# Patient Record
Sex: Female | Born: 1975 | Race: Black or African American | Hispanic: No | Marital: Single | Smoking: Never smoker
Health system: Southern US, Community
[De-identification: ages and names within clinical notes are randomized; demographics above are authoritative.]

## PROBLEM LIST (undated history)

## (undated) DIAGNOSIS — I10 Essential (primary) hypertension: Secondary | ICD-10-CM

## (undated) DIAGNOSIS — E119 Type 2 diabetes mellitus without complications: Secondary | ICD-10-CM

## (undated) DIAGNOSIS — E7800 Pure hypercholesterolemia, unspecified: Secondary | ICD-10-CM

---

## 2004-09-24 ENCOUNTER — Ambulatory Visit: Payer: Self-pay | Admitting: Family Medicine

## 2004-09-24 ENCOUNTER — Inpatient Hospital Stay (HOSPITAL_COMMUNITY): Admission: EM | Admit: 2004-09-24 | Discharge: 2004-10-03 | Payer: Self-pay | Admitting: Emergency Medicine

## 2004-10-06 ENCOUNTER — Ambulatory Visit: Payer: Self-pay | Admitting: Family Medicine

## 2004-10-07 ENCOUNTER — Ambulatory Visit: Payer: Self-pay | Admitting: Hematology and Oncology

## 2004-10-09 ENCOUNTER — Ambulatory Visit: Payer: Self-pay | Admitting: Family Medicine

## 2004-10-17 ENCOUNTER — Encounter (HOSPITAL_COMMUNITY): Admission: RE | Admit: 2004-10-17 | Discharge: 2005-01-15 | Payer: Self-pay | Admitting: *Deleted

## 2004-10-19 ENCOUNTER — Encounter (INDEPENDENT_AMBULATORY_CARE_PROVIDER_SITE_OTHER): Payer: Self-pay | Admitting: *Deleted

## 2004-10-19 LAB — CONVERTED CEMR LAB

## 2004-10-30 ENCOUNTER — Ambulatory Visit: Payer: Self-pay | Admitting: Family Medicine

## 2004-11-13 ENCOUNTER — Ambulatory Visit: Payer: Self-pay | Admitting: Family Medicine

## 2004-11-13 ENCOUNTER — Encounter (INDEPENDENT_AMBULATORY_CARE_PROVIDER_SITE_OTHER): Payer: Self-pay | Admitting: Specialist

## 2005-01-28 ENCOUNTER — Ambulatory Visit: Payer: Self-pay | Admitting: Hematology and Oncology

## 2005-02-10 ENCOUNTER — Ambulatory Visit: Payer: Self-pay | Admitting: Family Medicine

## 2005-03-31 ENCOUNTER — Ambulatory Visit: Payer: Self-pay | Admitting: Family Medicine

## 2005-04-10 ENCOUNTER — Ambulatory Visit: Payer: Self-pay | Admitting: Sports Medicine

## 2005-12-10 ENCOUNTER — Ambulatory Visit: Payer: Self-pay | Admitting: Sports Medicine

## 2006-01-20 ENCOUNTER — Ambulatory Visit: Payer: Self-pay | Admitting: Family Medicine

## 2006-05-14 ENCOUNTER — Encounter (INDEPENDENT_AMBULATORY_CARE_PROVIDER_SITE_OTHER): Payer: Self-pay | Admitting: *Deleted

## 2006-12-28 ENCOUNTER — Telehealth: Payer: Self-pay | Admitting: *Deleted

## 2007-01-26 ENCOUNTER — Ambulatory Visit: Payer: Self-pay | Admitting: Family Medicine

## 2007-02-07 IMAGING — CR DG CHEST 1V PORT
1 series · 1 of 1 positions shown · non-contrast
Comparison: None.

CLINICAL DATA: Diabetes and hyperglycemia.
 PORTABLE [id number] VIEW -09/24/04 AT 5998 HOURS:

[view not recorded]
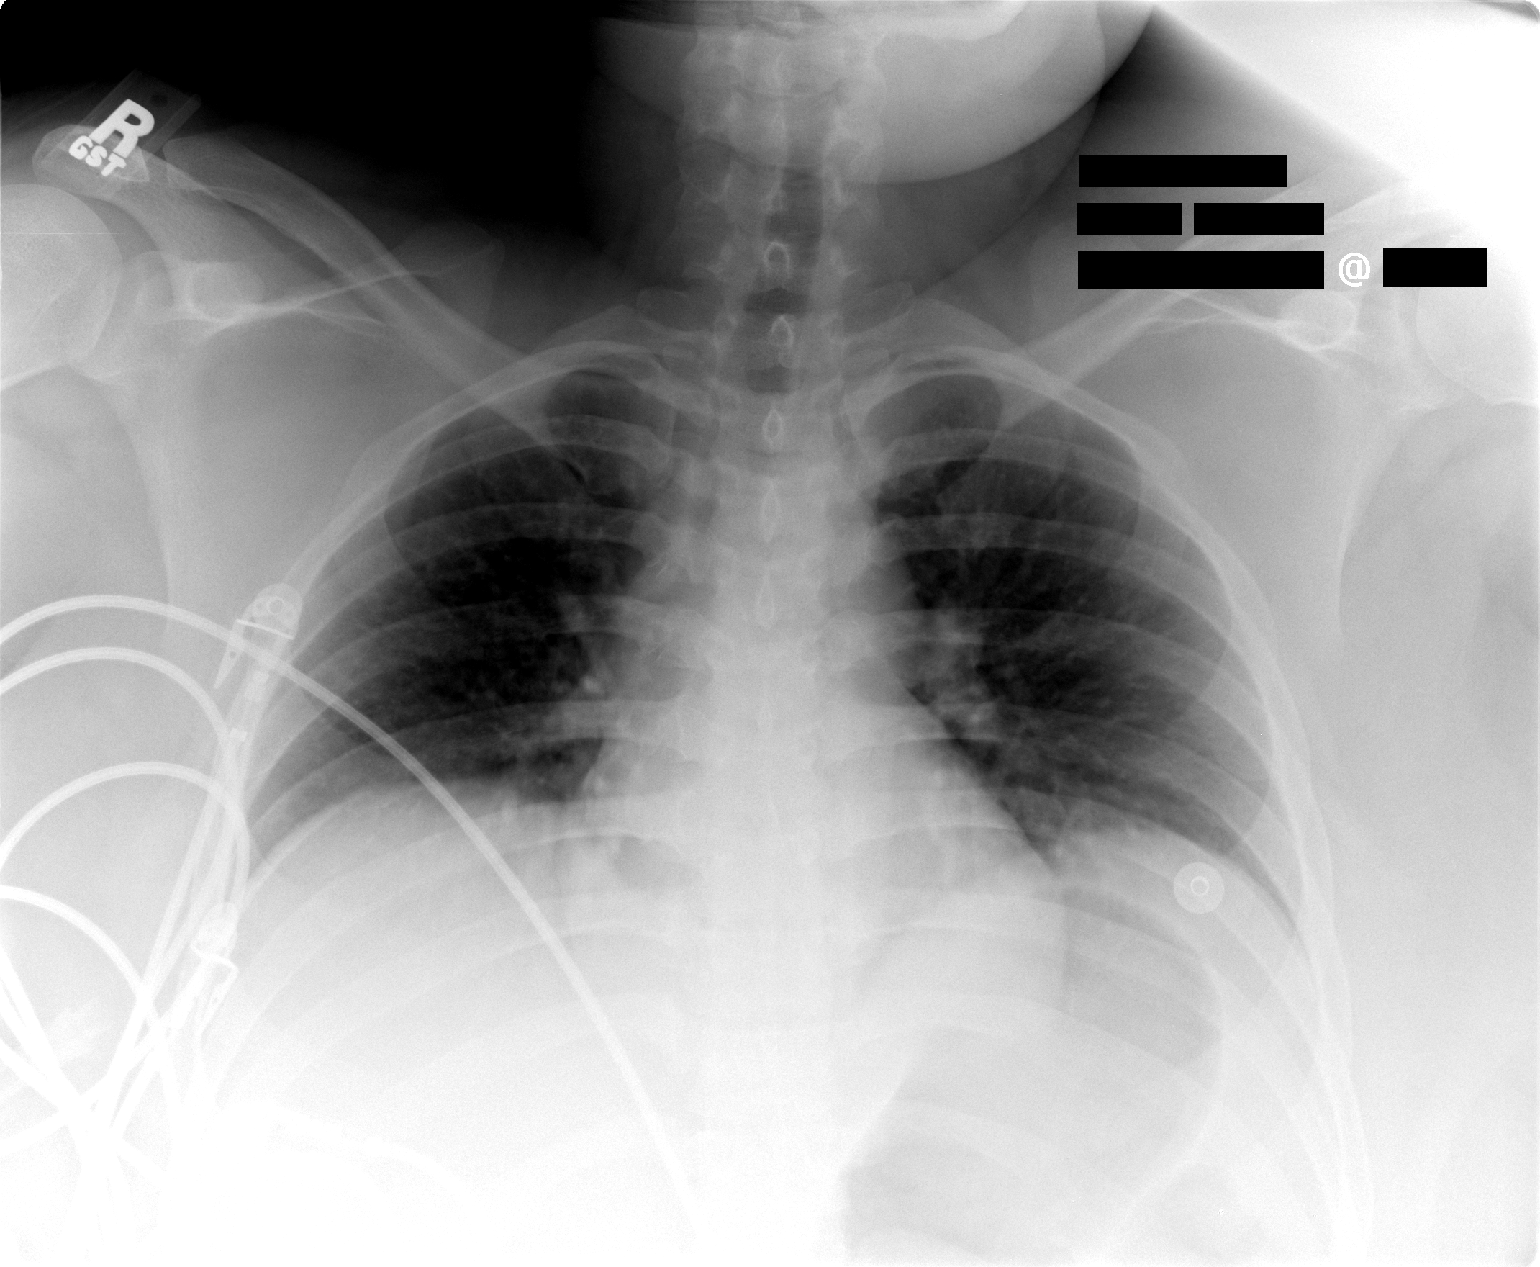

[1 of 1 positions shown; findings below may reference images not displayed]

There are low lung volumes with mild bibasilar atelectasis.  No consolidation, pleural effusion, or pneumothorax is seen.  The cardiomediastinal contours are normal.  Soft tissues are prominent.
IMPRESSION: Suboptimal inspiration with bibasilar atelectasis.  No consolidation.

## 2007-02-16 ENCOUNTER — Encounter (INDEPENDENT_AMBULATORY_CARE_PROVIDER_SITE_OTHER): Payer: Self-pay | Admitting: *Deleted

## 2008-03-05 ENCOUNTER — Encounter: Payer: Self-pay | Admitting: Sports Medicine

## 2010-04-17 NOTE — Miscellaneous (Signed)
 Summary: Orders Update  Clinical Lists Changes  Orders: Added new Test order of Basic Met-[facility] [phone]) - Signed Added new Test order of Lipid-[facility] [phone]) - Signed Added new Test order of A1C-[facility] ([id number]) - Signed Added new Test order of UA Microalbumin-[facility] ([id number]) - Signed Added new Test order of CBC-[facility] ([id number]) - Signed

## 2010-08-01 NOTE — Discharge Summary (Signed)
NAMEKADINCE, BOXLEY             ACCOUNT NO.:  [id number]   MEDICAL RECORD NO.:  [id number]          PATIENT TYPE:  INP   LOCATION:  5014                         FACILITY:  [facility]   PHYSICIAN:  Melina Fiddler, MD DATE OF BIRTH:  [birth year]   DATE OF ADMISSION:  09/23/2004  DATE OF DISCHARGE:  10/03/2004                                 DISCHARGE SUMMARY   CONSULTATIONS:  Nephrology, Maree Krabbe, M.D.   DISCHARGE DIAGNOSES:  1.  Diabetes mellitus.  2.  Acute renal failure.  3.  Chronic renal failure.  4.  Depression.  5.  Anemia.  6.  Gastroesophageal reflux disease.  7.  Alcohol abuse.   PROCEDURES:  1.  Renal ultrasound on September 27, 2004, showing mildly echogenic kidneys.  2.  Portable chest x-ray on September 24, 2004, showing bibasilar atelectasis      consistent with poor inspiration.  3.  Transfusion of two units of packed red blood cells on October 01, 2004.   DISCHARGE MEDICATIONS:  1.  Multivitamin with minerals.  2.  Thiamine 100 mg p.o. daily.  3.  Senokot S one tablet p.o. daily.  4.  Aranesp 100 mcg subcutaneous each week at the [facility].  5.  Lantus 35 units b.i.d. subcutaneous.  6.  Regular insulin 10 units t.i.d. a.c.  7.  Trazodone 25 mg p.o. q.h.s.  8.  Prilosec 20 mg p.o. daily.  9.  Lexapro 10 mg p.o. daily.   HOSPITAL COURSE:  The patient was admitted on September 24, 2004, complaining of  malaise x1 week, worsening acutely.  At the time of admission she was very  somnolent.  Her roommate is a Interior and spatial designer and had checked her for strep  test, which she said was positive.  The patient had a blood sugar on admit  up to 1100 and was placed on the Glucommander insulin drip.  We gave her a  penicillin shot for possible strep.  Immediately after admission, her blood  pressure dropped and her temperature spiked, so we started her on broad-  spectrum antibiotics including Zosyn and vancomycin and transferred her to  the ICU.  She responded well to intense  rehydration and blood sugar control.  Her antibiotics were discontinued after two days with negative blood  cultures.  Her kidney function was also decreased on admit with creatinine  up to 5.  She did maintain urine output throughout the admission.  Her blood  sugars were very difficult to control, and at one point she required more  than 300 units of insulin per day with uncontrolled sugars.  Over time she  required less and less insulin and was stable before discharge on a dosage  of 40 units Lantus b.i.d. and 15 units t.i.d. a.c.  We decided to be  conservative since she says that she does not eat as much at home, so we  sent her out on 33 units b.i.d. of Lantus and 10 units t.i.d. a.c. of  regular insulin.  She had some difficulty sleeping during her hospital  admission and was started on trazodone 25 mg p.o. q.h.s.  Renal  was  consulted after several days and total rehydration with a creatinine still  high around 4.  We obtained a renal ultrasound and many kidney-related blood  tests.  It was determined that the patient most likely has underlying  chronic kidney disease, and the patient was informed that she may need  dialysis in the future.  Her creatinine did continue to drop throughout her  hospital stay and is about 3.3 on discharge.  She was prepped for discharge  on about October 01, 2004, at which time we did a discharge CBC and she was  anemic down to 5.5.  At that time she received two units of packed red blood  cells.  She was fecal occult blood-negative at that time.  It was determined  that this is most likely an anemia related to decreased production of red  blood cells.  She is on Aranesp and her iron stores were high, so she was  not started on iron.  The Aranesp is 100 mcg subcutaneous every week.  She  was given one dose of Aranesp right before discharge.  Her anemia was fairly  stable after transfusion of two units PRBC at about 7 for hemoglobin.   Admission labs:   White blood count 22.8, hemoglobin 14.8, hematocrit 49.3,  platelets 424.  Neutrophils 92%, 21 absolute.  Sodium 12, potassium 4.8,  chloride 99, CO2 12, glucose 885, BUN 76, creatinine 6.2, calcium 9.0.  Ketones negative.  Lactic acid 6.1.  Urinalysis:  100 protein.   Problem 1.  DIABETES MELLITUS:  The patient came in with extremely high  blood sugars, up to 1100+.  After aggressive insulin administration and  rehydration, eventually the patient was maintained on a stable blood sugar  with 40 units of Lantus b.i.d. and 15 units q.a.c. t.i.d.  She had one  hypoglycemic episode earlier in the course when she was requiring very high  amounts of insulin.  At discharge, she was sent out on a more conservative  dose of 33-35 units of Lantus b.i.d. and 10 units of regular insulin t.i.d.  a.c.  She is instructed to check her blood sugars three times a day before  meals and keep a record for her follow-up appointment.  We believe that the  patient has type 2 diabetes but wanted to get her blood sugar stabilized on  an insulin regimen.  In the outpatient, it will be important to try to get  her on oral hypoglycemics and limit the need for insulin.  If insulin is  needed, it may be wise to switch to an NPH regimen since she has very  limited financial resources.  This may include an NPH dose since NPH is much  cheaper than Lantus.   Problem 2.  ACUTE RENAL FAILURE/CHRONIC RENAL FAILURE:  This will be managed  in the outpatient setting by renal doctors.  She has an appointment with Dr.  Kathrene Bongo on August 25 at 9:15 a.m.  In the meantime, her creatinine is  3.3 at discharge.  She was started on many vitamins, including multivitamin  with minerals, thiamine.  She is also on Aranesp 100 mg subcutaneous each  week.  It was thought that the patient had some element of prerenal failure  with her initial admission, but this did not correct back to normal creatinine so it was thought that the patient  had some chronic kidney  disease given the proteinuria and echogenic kidneys and high PTH but  creatinine does still continue to correct  partially, so there may be an  element of ATN involved as well.   Problem 3.  ANEMIA:  The patient came in with normal hemoglobin and  hematocrit.  She did have a drop in hemoglobin and hematocrit most likely  related to rehydration, a dilutional anemia, and her blood counts were  stable at around 9.5 for hemoglobin until the 19th, at which time her  hemoglobin was 5.5.  At that time we gave two units of packed red blood  cells and her anemia was fairly constant at discharge at around 7.0.  This  was without symptoms.  The patient's orthostatic vital signs were normal.  The patient was fecal occult blood-negative.  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  Also, part of her anemia is related to her chronic kidney disease and that  is why she is on the Aranesp.  Her iron supply was normal, so she was not  started on iron supplementation.   Problem 4.  GASTROESOPHAGEAL REFLUX DISEASE:  The patient was started on a  proton pump inhibitor, sent out on Prilosec 20 mg p.o. daily.   Problem 5.  DEPRESSION:  The patient came in on Lexapro 10 mg p.o. daily and  was also to be sent out on Lexapro 10 mg p.o. daily.  She also has trazodone  25 mg p.o. q.h.s. to help with sleep and mood.   Problem 6.  ALCOHOLISM:  The patient was counseled to stop drinking or, at  the very least, decrease her drinking as it is having a negative effect on  her health, especially her anemia.   DISCHARGE LABORATORY DATA:  CBC:  White blood cells 19.0, hemoglobin 7.0,  hematocrit 20.4, and platelets 301.  Sodium 144, potassium 3.4 before 40 mEq  of K-Dur administered, chloride 11, CO2 24, glucose 96, BUN 25, creatinine  3.3, calcium 9.1.  PT and APTT in the normal range.  Total protein over 24  hours of urine  collection 649.  ANA negative.  Parathyroid hormone 117.7.  HIV negative.   FOLLOW-UP ITEMS:  1.  Diabetes mellitus.  The patient is to follow up with the nurse      practitioner at the family practice center, Ms. Saxon, on October 09, 2004,      for follow-up of the last week of blood sugars.  After that, she will      try to follow up with Dr. Irving Burton.  At this appointment, it is      important that the patient be tried on p.o. medications for her diabetes      or, alternatively, NPH insulin since Lantus is so expensive and      difficult for her to afford.  2.  Acute renal failure.  This is to be managed by the renal doctors.  She      has an appointment with Dr. Kathrene Bongo on August 25 at 9:15 a.m.  3.  Alcohol abuse.  It is recommended that at the follow-up appointment with      Ms. Saxon the patient be counseled on her alcohol use.  4.  Depression.  The patient may benefit from increase in her Lexapro from      10 to 20 mg daily. 5.  Limited financial capacity.  It is important that the family practice      center be sensitive to her needs in this area and be willing to provide  samples of such medications as Lantus and Lexapro when available.  6.  Anemia.  The patient will follow up her hemoglobin and hematocrit on      Monday, July 24, in the morning at the family practice center.  The      patient will be given Aranesp 100 mg subcutaneous each week at the      family practice center, where it has been arranged for a special program      to reimburse the shots to the pharmacy.      Clifton Custard   AL/MEDQ  D:  10/03/2004  T:  10/04/2004  Job:  [id number]   cc:   Maree Krabbe, M.D.  Fax: [phone]   Angeline Slim, M.D.  Fax: [phone]   Ms. Humberto Seals, NP. [facility]

## 2010-08-01 NOTE — H&P (Signed)
Carla Elliott, Carla Elliott             ACCOUNT NO.:  [id number]   MEDICAL RECORD NO.:  [id number]          PATIENT TYPE:  INP   LOCATION:  1823                         FACILITY:  [facility]   PHYSICIAN:  Pearlean Brownie, M.D.DATE OF BIRTH:  [birth year]   DATE OF ADMISSION:  09/23/2004  DATE OF DISCHARGE:                                HISTORY & PHYSICAL   CHIEF COMPLAINT:  Weakness.   HISTORY OF PRESENT ILLNESS:  The patient is a 35 year old obese, African  American female with a one-week history of progressive weakness and malaise  that acutely worsened today. She was suffering from polydipsia and polyuria.  She has a roommate who is a Research scientist (medical) who did a strep test on her very  recently that was reportedly positive. Due to her acute worsening of  symptoms, she was brought to an Urgent Care today and transferred here after  an increased blood sugar and decreased responsiveness.   MEDICATIONS:  Lexapro.   ALLERGIES:  No known drug allergies.   PAST MEDICAL HISTORY:  Depression with a suicide attempt 10 years ago.   FAMILY HISTORY:  Positive for diabetes and multiple myelomas.   SOCIAL HISTORY:  Lives with her roommate. Denies smoking or drugs but admits  to alcohol use.,   REVIEW OF SYSTEMS:  Negative.   PHYSICAL EXAMINATION:  VITAL SIGNS:  Temperature 98.1, blood pressure  118/58, pulse 139, respiratory rate 22 to 28. Saturations 96% on two liters.  GENERAL:  Moderate distress, obese, African American female.  CARDIOVASCULAR:  Tachycardic. Regular rhythm. No murmurs, gallops, or rubs.  No carotid bruits. Distal pulses are 2+.  PULMONARY:  Clear to auscultation bilaterally.  ABDOMEN:  Soft and nontender with decreased bowel sounds. No  hepatosplenomegaly and obese belly.  EXTREMITIES:  No edema, no cyanosis.  NEUROLOGICAL:  Diffusely weak. Difficulty cooperating, concentrating for  cranial nerve and other neurological test.  LYMPHATICS:  No lymphadenopathy.  BACK:  No CVA  tenderness.  MUSCULOSKELETAL:  The patient is diffusely weak.  HEENT:  Normocephalic and atraumatic. Moist mucus membranes. Pupils are  equal, round, and reactive to light and accommodation.  PSYCHIATRIC:  The patient responds appropriately after prompting but is  somnolent.  GENITOURINARY:  Foley in place.   LABORATORY DATA:  EKG shows sinus tachycardia. Sodium 131, potassium 4.7,  chloride 103, bicarbonate of 14.0, BUN 77, creatinine 5.0. Glucose greater  than 700. Anion gap 23. Hemoglobin 14.8, white blood cell 22.8, platelets  424,000. Absolute neutrophil count 21. Urine pregnancy was pending and she  is reportedly rapid strep positive.   ASSESSMENT:  A 35 year old African American female with acute hyperglycemia  and anion gap, metabolic acidosis.   PLAN:  1.  Hyperglycemia:  Continue insulin drip and intravenous fluids until anion      gap or acidosis corrects. We will check B-type natriuretic peptide every      two hours until this corrects. We will then change to a subcutaneous      insulin regimen most likely consisting of Lantus and most likely attempt      to switch to p.o. medications in an outpatient setting.  2.  Positive rapid strep test:  The patient received penicillin-G IM in the      emergency department.  3.  Leukocytosis:  Most likely this is related to infection, possibly strep      and was treated with penicillin. However, we will check the urine. If      the patient has continued leukocytosis or worsening leukocytosis of if      the patient becomes afebrile, we would consider checking a chest x-ray,      although I doubt this will review anything as physical exam showed the      lungs clear to auscultation and there is no history of symptoms      consistent with pneumonia.  4.  Depression:  We will hold Lexapro for now for somnolence and restart      once the patient is more responsive.       AL/MEDQ  D:  09/24/2004  T:  09/24/2004  Job:  [id number]

## 2010-08-01 NOTE — Discharge Summary (Signed)
NAMEAURIA, Carla Elliott             ACCOUNT NO.:  [id number]   MEDICAL RECORD NO.:  [id number]          PATIENT TYPE:  INP   LOCATION:  5014                         FACILITY:  [facility]   PHYSICIAN:  Melina Fiddler, MD DATE OF BIRTH:  01/20/1976   DATE OF ADMISSION:  09/23/2004  DATE OF DISCHARGE:                                 DISCHARGE SUMMARY   ADDENDUM:  Please add to the medication list:  Tums x2 b.i.d.   Also add under the renal failure paragraph:  The patient will take two  Tums b.i.d. because of the calcium, which will act as a phosphate binder.      Clifton Custard   AL/MEDQ  D:  10/03/2004  T:  10/04/2004  Job:  [id number]

## 2010-08-01 NOTE — Consult Note (Signed)
NAMEAIJALON, Carla Elliott             ACCOUNT NO.:  [id number]   MEDICAL RECORD NO.:  [id number]          PATIENT TYPE:  INP   LOCATION:  2303                         FACILITY:  [facility]   PHYSICIAN:  Maree Krabbe, M.D.DATE OF BIRTH:  17-May-1975   DATE OF CONSULTATION:  09/26/2004  DATE OF DISCHARGE:                                   CONSULTATION   REASON FOR CONSULTATION:  Renal insufficiency.   HISTORY:  The patient is a 35 year old African American female with a  history of obesity and depression, but no prior renal disease admitted September 23, 2004, with a less than or equal to one week of polyuria and polydipsia,  and brought to the hospital with hypotension and tachycardia, found to have  severe hyperglycemia with a blood sugar over 700, consistent with new onset  diabetes. This was associated with the moderately severe acidemia with pH of  7.2 and renal insufficiency with a creatinine of 5.8. With hydration over  the past three days urine output has finally started to improve from about  30 cc to 50 cc per hour yesterday up to greater than 100 cc per hour  currently and over the last eight hours she had 1200 cc of urine without  diuretics. She is getting IV fluids now at 150 cc and 200 cc per hour and  creatinine is down to 5.0 today. Initially urinalysis was mostly benign with  100 protein and no red cells or white cells; however, a follow-up urinalysis  showed greater than 300 protein with 25 to 50 red blood cells and 5 to 10  white blood cells. Blood pressure on admission was 90/50 with a heart rate  of 140 taken by  EMS.   The patient's complaint relate mainly to the one week prior to admission and  prior to that she says she was feeling good and had no chronic symptoms of  anorexia, fatigue, nausea, vomiting, or other symptoms of uremia. Her main  symptoms were polydipsia.   PAST MEDICAL HISTORY:  Depression on Lexapro.   MEDICATIONS AT HOME:  She was on Lexapro only and  in the hospital on  Lexapro, Lovenox, Lantus, and Protonix.   ALLERGIES:  None.   FAMILY HISTORY:  Single. No children. The patient works as a Printmaker. No tobacco or alcohol.   REVIEW OF SYSTEMS:  GENERAL: Denies fever, chills, weight loss, nightsweats.  ENT: Denies hearing loss, visual change, sore throat, difficulty swallowing.  CARDIORESPIRATORY: Denies chest pain, shortness of breath, productive cough,  orthopnea, or PND. GI: As above. GU: Denies dysuria, hematuria, history of  kidney problems, or kidney stones. MUSCULOSKELETAL: Denies arthralgias,  myalgias, history of arthritis. NEUROLOGIC: Denies focal numbness, weakness,  or history of stroke or seizure. ENDOCRINE: As above. DERMATOLOGIC: Denies  any rash or hair loss.   PHYSICAL EXAMINATION:  VITAL SIGNS: Blood pressure 140/70, pulse 100,  respirations 20, temperature 99.7. I&Os yesterday 6200 and 1300 out.  GENERAL: This is a quite obese black female in no acute distress, calm and  pleasant.  SKIN: Without rash.  HEENT: PERRL. EOMI.  Throat clear.  NECK: Supple with flat neck veins.  CHEST: Clear throughout.  CARDIAC: Regular rate and rhythm without murmurs, rubs, or gallops.  ABDOMEN: Soft, obese, nontender. No organomegaly.  EXTREMITIES: No significant lower extremity edema. Pulses are 2+ in the legs  with no bruits.   LABORATORY DATA:  Sodium 147, potassium 3.9, CO2 18, BUN 52, and creatinine  5.2 today. White blood cell count 20,000, hemoglobin 12, calcium 7,  phosphorus 2.4.   Chest x-ray reveals no acute disease. Urinalysis #1 reveals greater than  1000 glucose, protein 100, white blood cells 0-2, red blood cells 0-2, a few  bacteria. Urinalysis #2 reveals glucose of 100, protein greater than 300,  white blood cell count 3-6, RBC 25-50, and bacteria few.   IMPRESSION:  Renal insufficiency in the setting of a young, overweight black  female in the setting of new onset diabetes with severe volume  losses and  hypotension on admission. Initial urinalysis was probably more accurate and  was benign except for 100 mg/dl of protein. I will examine the urine  tomorrow after the 24-hour urine is collected. I suspect this is acute renal  insufficiency related to hypovolemia and volume losses, and since the urine  output is improving today and the volume is probably now close to normal, I  would not pursue further diagnostic workup until we can see the results of  the 24 hour urine collection, re-examine the urine sediment and see what her  creatinine does over the next day or two. She could have underlying  intrinsic renal disease also, but I think we will await these other studies  before pursuing further diagnostic workup. I would order a renal ultrasound  and I have done this. I agree with the current IV fluids and supportive care  as you are doing.       RDS/MEDQ  D:  09/26/2004  T:  09/26/2004  Job:  [id number]

## 2012-01-22 ENCOUNTER — Emergency Department (HOSPITAL_BASED_OUTPATIENT_CLINIC_OR_DEPARTMENT_OTHER)
Admission: EM | Admit: 2012-01-22 | Discharge: 2012-01-23 | Disposition: A | Discharge status: Home / Self Care | Payer: Self-pay | Attending: Emergency Medicine | Admitting: Emergency Medicine

## 2012-01-22 ENCOUNTER — Encounter (HOSPITAL_BASED_OUTPATIENT_CLINIC_OR_DEPARTMENT_OTHER): Payer: Self-pay | Admitting: *Deleted

## 2012-01-22 DIAGNOSIS — E119 Type 2 diabetes mellitus without complications: Secondary | ICD-10-CM | POA: Insufficient documentation

## 2012-01-22 DIAGNOSIS — R739 Hyperglycemia, unspecified: Secondary | ICD-10-CM

## 2012-01-22 DIAGNOSIS — R7309 Other abnormal glucose: Secondary | ICD-10-CM | POA: Insufficient documentation

## 2012-01-22 DIAGNOSIS — Z79899 Other long term (current) drug therapy: Secondary | ICD-10-CM | POA: Insufficient documentation

## 2012-01-22 DIAGNOSIS — R51 Headache: Secondary | ICD-10-CM | POA: Insufficient documentation

## 2012-01-22 DIAGNOSIS — R0789 Other chest pain: Secondary | ICD-10-CM | POA: Insufficient documentation

## 2012-01-22 HISTORY — DX: Type 2 diabetes mellitus without complications: E11.9

## 2012-01-22 LAB — URINALYSIS, ROUTINE W REFLEX MICROSCOPIC
Glucose, UA: >1000 mg/dL — AB
Ketones, ur: 15 mg/dL — AB
Leukocytes, UA: NEGATIVE
Nitrite: NEGATIVE
Specific Gravity, Urine: 1.041 — ABNORMAL HIGH (ref 1.005–1.030)
pH: 5 (ref 5.0–8.0)

## 2012-01-22 LAB — GLUCOSE, CAPILLARY

## 2012-01-22 LAB — CBC WITH DIFFERENTIAL/PLATELET
Basophils Absolute: 0 10*3/uL (ref 0.0–0.1)
Basophils Relative: 0 % (ref 0–1)
Eosinophils Absolute: 0.1 10*3/uL (ref 0.0–0.7)
Eosinophils Relative: 1 % (ref 0–5)
MCH: 25.8 pg — ABNORMAL LOW (ref 26.0–34.0)
MCHC: 32.3 g/dL (ref 30.0–36.0)
MCV: 80 fL (ref 78.0–100.0)
Neutrophils Relative %: 65 % (ref 43–77)
Platelets: 290 10*3/uL (ref 150–400)
RBC: 4.3 MIL/uL (ref 3.87–5.11)
RDW: 12.4 % (ref 11.5–15.5)

## 2012-01-22 LAB — COMPREHENSIVE METABOLIC PANEL
ALT: 13 U/L (ref 0–35)
Albumin: 4.2 g/dL (ref 3.5–5.2)
Alkaline Phosphatase: 91 U/L (ref 39–117)
Calcium: 9.8 mg/dL (ref 8.4–10.5)
GFR calc Af Amer: >90 mL/min (ref >90)
Glucose, Bld: 297 mg/dL — ABNORMAL HIGH (ref 70–99)
Potassium: 3.5 mEq/L (ref 3.5–5.1)
Sodium: 134 mEq/L — ABNORMAL LOW (ref 135–145)
Total Protein: 8.3 g/dL (ref 6.0–8.3)

## 2012-01-22 LAB — URINE MICROSCOPIC-ADD ON

## 2012-01-22 LAB — PREGNANCY, URINE: Preg Test, Ur: NEGATIVE

## 2012-01-22 MED ORDER — INSULIN ASPART 100 UNIT/ML IV SOLN
5.0000 [IU] | Freq: Once | INTRAVENOUS | Status: AC
  Administered 2012-01-22: 5 [IU] via INTRAVENOUS
  Filled 2012-01-22: qty 0.05
  Filled 2012-01-22: qty 1

## 2012-01-22 MED ORDER — SODIUM CHLORIDE 0.9 % IV BOLUS (SEPSIS)
1000.0000 mL | Freq: Once | INTRAVENOUS | Status: AC
  Administered 2012-01-22: 1000 mL via INTRAVENOUS

## 2012-01-22 MED ORDER — METFORMIN HCL 500 MG PO TABS: 500.0000 mg | ORAL_TABLET | Freq: Two times a day (BID) | ORAL | Status: DC

## 2012-01-22 NOTE — ED Provider Notes (Signed)
History     CSN: [id number]  Arrival date & time 01/22/12  2042   First MD Initiated Contact with Patient 01/22/12 2154      Chief Complaint  Patient presents with  . Hyperglycemia  . Chest Pain    (Consider location/radiation/quality/duration/timing/severity/associated sxs/prior treatment) HPI Comments: Patient with history of DM.  She lost well over 100 lbs several years ago and was eventually taken off of all DM meds.  She presents today complaining of not feeling well, headache, chest pains, and elevated blood sugar at home.  She denies any recent changes in diet.  No history of any cardiac problems.    The history is provided by the patient.    Past Medical History  Diagnosis Date  . Diabetes mellitus without complication     History reviewed. No pertinent past surgical history.  History reviewed. No pertinent family history.  History  Substance Use Topics  . Smoking status: Never Smoker   . Smokeless tobacco: Not on file  . Alcohol Use: Yes    OB History    Grav Para Term Preterm Abortions TAB SAB Ect Mult Living                  Review of Systems  All other systems reviewed and are negative.    Allergies  Review of patient's allergies indicates no known allergies.  Home Medications  No current outpatient prescriptions on file.  BP 175/103  Pulse 114  Temp 98.3 F (36.8 C) (Oral)  Resp 20  Ht 5\' 9"  (1.753 m)  Wt 250 lb (113.399 kg)  BMI 36.92 kg/m2  SpO2 100%  Physical Exam  Nursing note and vitals reviewed. Constitutional: She is oriented to person, place, and time. She appears well-developed and well-nourished. No distress.  HENT:  Head: Normocephalic and atraumatic.  Mouth/Throat: Oropharynx is clear and moist.  Eyes: EOM are normal. Pupils are equal, round, and reactive to light.  Neck: Normal range of motion. Neck supple.  Cardiovascular: Normal rate and regular rhythm.  Exam reveals no gallop and no friction rub.   No murmur  heard. Pulmonary/Chest: Effort normal and breath sounds normal. No respiratory distress. She has no wheezes.  Abdominal: Soft. Bowel sounds are normal. She exhibits no distension. There is no tenderness.  Musculoskeletal: Normal range of motion.  Neurological: She is alert and oriented to person, place, and time. No cranial nerve deficit. She exhibits normal muscle tone. Coordination normal.  Skin: Skin is warm and dry. She is not diaphoretic.    ED Course  Procedures (including critical care time)  Labs Reviewed  GLUCOSE, CAPILLARY - Abnormal; Notable for the following:    Glucose-Capillary 312 (*)     All other components within normal limits  URINALYSIS, ROUTINE W REFLEX MICROSCOPIC - Abnormal; Notable for the following:    Specific Gravity, Urine 1.041 (*)     Glucose, UA >1000 (*)     Hgb urine dipstick TRACE (*)     Ketones, ur 15 (*)     Protein, ur 30 (*)     All other components within normal limits  PREGNANCY, URINE  URINE MICROSCOPIC-ADD ON  CBC WITH DIFFERENTIAL  COMPREHENSIVE METABOLIC PANEL  TROPONIN I   No results found.   No diagnosis found.   Date: 01/22/2012  Rate: 109  Rhythm: sinus tachycardia  QRS Axis: normal  Intervals: normal  ST/T Wave abnormalities: normal  Conduction Disutrbances:none  Narrative Interpretation:   Old EKG Reviewed: unchanged  MDM  The patient presents with malaise, elevated blood sugar.  She has been off of her meds for the past 3 years after weight loss.  Her sugar today was over 300.  She was given ivf, insulin and is feeling better.  Her sugar is now under 200 and the labs do not reflect dka.  She will be given a prescription for metformin to be taken.  She is having some insurance issues and does not have a primary physician.  I will prescribe her a 3 month supply to allow her time to sort out her provider issues.  Return prn.        Geoffery Lyons, MD 01/23/12 [phone]

## 2012-01-22 NOTE — ED Notes (Signed)
Family at bedside. 

## 2012-01-22 NOTE — ED Notes (Signed)
Pt was on meds for diabetes for years but was taken off by PMD in 2011. Pt now has been noticing an increase on CBGs and is experiencing CP.

## 2012-01-22 NOTE — ED Notes (Signed)
Pt c/o elevated blood sugar for few days and intermittent CP.

## 2012-01-31 ENCOUNTER — Encounter (HOSPITAL_BASED_OUTPATIENT_CLINIC_OR_DEPARTMENT_OTHER): Payer: Self-pay

## 2012-01-31 ENCOUNTER — Emergency Department (HOSPITAL_BASED_OUTPATIENT_CLINIC_OR_DEPARTMENT_OTHER)
Admission: EM | Admit: 2012-01-31 | Discharge: 2012-01-31 | Disposition: A | Discharge status: Home / Self Care | Payer: Self-pay | Attending: Emergency Medicine | Admitting: Emergency Medicine

## 2012-01-31 DIAGNOSIS — E1169 Type 2 diabetes mellitus with other specified complication: Secondary | ICD-10-CM | POA: Insufficient documentation

## 2012-01-31 DIAGNOSIS — R5381 Other malaise: Secondary | ICD-10-CM | POA: Insufficient documentation

## 2012-01-31 DIAGNOSIS — E119 Type 2 diabetes mellitus without complications: Secondary | ICD-10-CM

## 2012-01-31 DIAGNOSIS — Z79899 Other long term (current) drug therapy: Secondary | ICD-10-CM | POA: Insufficient documentation

## 2012-01-31 DIAGNOSIS — R5383 Other fatigue: Secondary | ICD-10-CM | POA: Insufficient documentation

## 2012-01-31 DIAGNOSIS — R739 Hyperglycemia, unspecified: Secondary | ICD-10-CM

## 2012-01-31 MED ORDER — GLIPIZIDE 5 MG PO TABS: 5.0000 mg | ORAL_TABLET | Freq: Every day | ORAL | Status: DC

## 2012-01-31 NOTE — ED Provider Notes (Signed)
History     CSN: [id number]  Arrival date & time 01/31/12  0039   First MD Initiated Contact with Patient 01/31/12 0104      Chief Complaint  Patient presents with  . Hyperglycemia     The history is provided by the patient.  hyperglycemia Onset - several weeks ago Course - stable Improved by - nothing Worsened by nothing  Pt presents for hyperglycemia.  She reports she has h/o diabetes, but has been off meds for several yrs as it was diet controlled and she lost weight. She reports in past several weeks the glucose has elevated (ranges from 200-400) She was seen in ED on 11/8, restarted on metformin.  She reports since starting metformin she has felt fatigued No vomiting/diarrhea.  No fever.  No cp/sob.  She reports mild headache.  No focal weakness.  No abdominal pain.  No dysuria.  No vag bleeding/discharge.  She is able to eat/drink normally.  She is able to go about daily functions   Past Medical History  Diagnosis Date  . Diabetes mellitus without complication     History reviewed. No pertinent past surgical history.  No family history on file.  History  Substance Use Topics  . Smoking status: Never Smoker   . Smokeless tobacco: Not on file  . Alcohol Use: Yes    OB History    Grav Para Term Preterm Abortions TAB SAB Ect Mult Living                  Review of Systems  Constitutional: Negative for fever.  Respiratory: Negative for shortness of breath.   Cardiovascular: Negative for chest pain.  Gastrointestinal: Negative for vomiting and abdominal pain.  Neurological: Negative for syncope.  All other systems reviewed and are negative.    Allergies  Review of patient's allergies indicates no known allergies.  Home Medications   Current Outpatient Rx  Name  Route  Sig  Dispense  Refill  . METFORMIN HCL 500 MG PO TABS   Oral   Take 1 tablet (500 mg total) by mouth 2 (two) times daily with a meal.   60 tablet   2   . GLIPIZIDE 5 MG PO TABS  Oral   Take 1 tablet (5 mg total) by mouth daily.   30 tablet   0     BP 130/92  Pulse 104  Temp 98.3 F (36.8 C) (Oral)  Resp 20  SpO2 99%  Physical Exam CONSTITUTIONAL: Well developed/well nourished HEAD AND FACE: Normocephalic/atraumatic EYES: EOMI/PERRL, conjunctiva pink ENMT: Mucous membranes moist NECK: supple no meningeal signs SPINE:entire spine nontender CV: S1/S2 noted, no murmurs/rubs/gallops noted LUNGS: Lungs are clear to auscultation bilaterally, no apparent distress ABDOMEN: soft, nontender, no rebound or guarding GU:no cva tenderness NEURO: Pt is awake/alert, moves all extremitiesx4 EXTREMITIES: pulses normal, full ROM SKIN: warm, color normal PSYCH: no abnormalities of mood noted  ED Course  Procedures 1. Hyperglycemia   2. Diabetes mellitus    Pt appears well, no distress, does not smell ketotic.  I offered repeat labs, but she refused ( recent labs reviewed) She thinks fatigue is from metformin.  Will hold this for now, and I offered to start low dose of glipizide.  She can check glucose frequently at home.  She used to be on insulin but is unsure which one and without primary followup would not feel comfortable starting insulin.   She does not appear dehydrated.  Doubt acs.  No fever reported to  suggest infection  MDM  Nursing notes including past medical history and social history reviewed and considered in documentation Previous records reviewed and considered         Joya Gaskins, MD 01/31/12 [phone]

## 2012-01-31 NOTE — ED Notes (Signed)
Patient reports that she has been off her diabetic medications for over 3 years after losing weight and exercising. Seen in ED 1 week ago and Blood Sugars running in high 300-400's. Started back on metformin and blood sugar been running in 200-300's all week. Reports that she continues to feel tired and has developed nausea since starting the metformin. Denies any pain, alert and oriented.

## 2012-01-31 NOTE — ED Notes (Addendum)
CBG 318 

## 2012-01-31 NOTE — ED Notes (Signed)
MD at bedside. 

## 2012-01-31 NOTE — ED Notes (Signed)
Patient reports that she has been feeling increasingly tired and BS was running high 300's pta. Was seen in ED 1 week ago and metformin was started after being off medication for 3 years. Nausea since starting the metformin

## 2012-01-31 NOTE — ED Notes (Signed)
I took CBG and got reading of 318 mg./dcltr.

## 2012-01-31 NOTE — ED Notes (Signed)
D/c home with friend.

## 2012-02-01 LAB — GLUCOSE, CAPILLARY: Glucose-Capillary: 318 mg/dL — ABNORMAL HIGH (ref 70–99)

## 2013-04-12 ENCOUNTER — Encounter (HOSPITAL_BASED_OUTPATIENT_CLINIC_OR_DEPARTMENT_OTHER): Payer: Self-pay | Admitting: Emergency Medicine

## 2013-04-12 ENCOUNTER — Emergency Department (HOSPITAL_BASED_OUTPATIENT_CLINIC_OR_DEPARTMENT_OTHER)
Admission: EM | Admit: 2013-04-12 | Discharge: 2013-04-12 | Disposition: A | Discharge status: Home / Self Care | Payer: Self-pay | Attending: Emergency Medicine | Admitting: Emergency Medicine

## 2013-04-12 DIAGNOSIS — E119 Type 2 diabetes mellitus without complications: Secondary | ICD-10-CM | POA: Insufficient documentation

## 2013-04-12 DIAGNOSIS — Z79899 Other long term (current) drug therapy: Secondary | ICD-10-CM | POA: Insufficient documentation

## 2013-04-12 MED ORDER — METFORMIN HCL 850 MG PO TABS: 850.0000 mg | ORAL_TABLET | Freq: Two times a day (BID) | ORAL | Status: AC

## 2013-04-12 MED ORDER — GLIPIZIDE 5 MG PO TABS: 5.0000 mg | ORAL_TABLET | Freq: Every day | ORAL | Status: AC

## 2013-04-12 NOTE — ED Notes (Signed)
Pt requesting refill of glipizide 5mg   , metformin 500mg 

## 2013-04-12 NOTE — Discharge Instructions (Signed)
Diabetes and Sick Day Management Blood sugar (glucose) can be more difficult to control when you are sick. Colds, fever, flu, nausea, vomiting, and diarrhea are all examples of common illnesses that can cause problems for people with diabetes. Loss of body fluids (dehydration) from fever, vomiting, diarrhea, infection, and the stress of a sickness can all cause blood glucose levels to increase. Because of this, it is very important to take your diabetes medicines and to eat some form of carbohydrate food when you are sick. Liquid or soft foods are often tolerated, and they help to replace fluids. HOME CARE INSTRUCTIONS These main guidelines are intended for managing a short-term (24 hours or less) sickness:  Take your usual dose of insulin or oral diabetes medicine. An exception would be if you take any form of metformin. If you cannot eat or drink, you can become dehydrated and should not take this medicine.  Continue to take your insulin even if you are unable to eat solid foods or are vomiting. Your insulin dose may stay the same, or it may need to be increased when you are sick.  You will need to test your blood glucose more often, generally every 2 4 hours. If you have type 1 diabetes, test your urine for ketones every 4 hours. If you have type 2 diabetes, test your urine for ketones as directed by your health care provider.  Eat some form of food that contains carbohydrates. The carbohydrates can be in solid or liquid form. You should eat 45 50 g of carbohydrates every 3 4 hours.  Replace fluids if you have a fever, vomit, or have diarrhea. Ask your health care provider for specific rehydration instructions.  Watch carefully for the signs of ketoacidosis if you have type 1 diabetes. Call your health care provider if any of the following symptoms are present, especially in children:  Moderate to large ketones in the urine along with a high blood glucose level.  Severe  nausea.  Vomiting.  Diarrhea.  Abdominal pain.  Rapid breathing.  Drink extra liquids that do not contain sugar, such as water or sugar-free liquids, or caffeine.  Be careful with over-the-counter medicines. Read the labels. They may contain sugar or types of sugars that can increase your blood glucose level. Food Choices for Illness All of the food choices below contain about 15 g of carbohydrates. Plan ahead and keep some of these foods around.    to  cup carbonated beverage containing sugar. Carbonated beverages will usually be better tolerated if they are opened and left at room temperature for a few minutes.   of a twin frozen ice pop.   cup regular gelatin.   cup juice.   cup ice cream or frozen yogurt.   cup cooked cereal.   cup sherbet.  1 cup clear broth or soup.  1 cup cream soup.   cup regular custard.   cup regular pudding.  1 cup sports drink.  1 cup plain yogurt.  1 slice toast.  6 squares saltine crackers.  5 vanilla wafers.   cup sports drink. SEEK MEDICAL CARE IF:   You are unable to drink fluids, even small amounts.  You have nausea and vomiting for more than 6 hours.  You have diarrhea for more than 6 hours.  Your blood glucose level is more than 240 mg/dL, even with additional insulin.  There is a change in mental status.  You develop an additional serious sickness.  You have been sick for 2  days and are not getting better.  You have had a fever for 2 days. SEEK IMMEDIATE MEDICAL CARE IF:  You have difficulty breathing.  You have moderate to large ketone levels. MAKE SURE YOU:  Understand these instructions.  Will watch your condition.  Will get help right away if you are not doing well or get worse. Document Released: 03/05/2003 Document Revised: 11/02/2012 Document Reviewed: 08/09/2012 [facility] Patient Information 2014 [place], [place].  Diabetes Meal Planning Guide The diabetes meal planning guide is a  tool to help you plan your meals and snacks. It is important for people with diabetes to manage their blood glucose (sugar) levels. Choosing the right foods and the right amounts throughout your day will help control your blood glucose. Eating right can even help you improve your blood pressure and reach or maintain a healthy weight. CARBOHYDRATE COUNTING MADE EASY When you eat carbohydrates, they turn to sugar. This raises your blood glucose level. Counting carbohydrates can help you control this level so you feel better. When you plan your meals by counting carbohydrates, you can have more flexibility in what you eat and balance your medicine with your food intake. Carbohydrate counting simply means adding up the total amount of carbohydrate grams in your meals and snacks. Try to eat about the same amount at each meal. Foods with carbohydrates are listed below. Each portion below is 1 carbohydrate serving or 15 grams of carbohydrates. Ask your dietician how many grams of carbohydrates you should eat at each meal or snack. Grains and Starches  1 slice bread.   English muffin or hotdog/hamburger bun.   cup cold cereal (unsweetened).   cup cooked pasta or rice.   cup starchy vegetables (corn, potatoes, peas, beans, winter squash).  1 tortilla (6 inches).   bagel.  1 waffle or pancake (size of a CD).   cup cooked cereal.  4 to 6 small crackers. *Whole grain is recommended. Fruit  1 cup fresh unsweetened berries, melon, papaya, pineapple.  1 small fresh fruit.   banana or mango.   cup fruit juice (4 oz unsweetened).   cup canned fruit in natural juice or water.  2 tbs dried fruit.  12 to 15 grapes or cherries. Milk and Yogurt  1 cup fat-free or 1% milk.  1 cup soy milk.  6 oz light yogurt with sugar-free sweetener.  6 oz low-fat soy yogurt.  6 oz plain yogurt. Vegetables  1 cup raw or  cup cooked is counted as 0 carbohydrates or a "free" food.  If you eat  3 or more servings at 1 meal, count them as 1 carbohydrate serving. Other Carbohydrates   oz chips or pretzels.   cup ice cream or frozen yogurt.   cup sherbet or sorbet.  2 inch square cake, no frosting.  1 tbs honey, sugar, jam, jelly, or syrup.  2 small cookies.  3 squares of graham crackers.  3 cups popcorn.  6 crackers.  1 cup broth-based soup.  Count 1 cup casserole or other mixed foods as 2 carbohydrate servings.  Foods with less than 20 calories in a serving may be counted as 0 carbohydrates or a "free" food. You may want to purchase a book or computer software that lists the carbohydrate gram counts of different foods. In addition, the nutrition facts panel on the labels of the foods you eat are a good source of this information. The label will tell you how big the serving size is and the total number of  carbohydrate grams you will be eating per serving. Divide this number by 15 to obtain the number of carbohydrate servings in a portion. Remember, 1 carbohydrate serving equals 15 grams of carbohydrate. SERVING SIZES Measuring foods and serving sizes helps you make sure you are getting the right amount of food. The list below tells how big or small some common serving sizes are.  1 oz.........4 stacked dice.  3 oz........Marland Kitchen.Deck of cards.  1 tsp.......Marland Kitchen.Tip of little finger.  1 tbs......Marland Kitchen.Marland Kitchen.Thumb.  2 tbs.......Marland Kitchen.Golf ball.   cup......Marland Kitchen.Half of a fist.  1 cup.......Marland Kitchen.A fist. SAMPLE DIABETES MEAL PLAN Below is a sample meal plan that includes foods from the grain and starches, dairy, vegetable, fruit, and meat groups. A dietician can individualize a meal plan to fit your calorie needs and tell you the number of servings needed from each food group. However, controlling the total amount of carbohydrates in your meal or snack is more important than making sure you include all of the food groups at every meal. You may interchange carbohydrate containing foods (dairy,  starches, and fruits). The meal plan below is an example of a 2000 calorie diet using carbohydrate counting. This meal plan has 17 carbohydrate servings. Breakfast  1 cup oatmeal (2 carb servings).   cup light yogurt (1 carb serving).  1 cup blueberries (1 carb serving).   cup almonds. Snack  1 large apple (2 carb servings).  1 low-fat string cheese stick. Lunch  Chicken breast salad.  1 cup spinach.   cup chopped tomatoes.  2 oz chicken breast, sliced.  2 tbs low-fat [place] dressing.  12 whole-wheat crackers (2 carb servings).  12 to 15 grapes (1 carb serving).  1 cup low-fat milk (1 carb serving). Snack  1 cup carrots.   cup hummus (1 carb serving). Dinner  3 oz broiled salmon.  1 cup brown rice (3 carb servings). Snack  1  cups steamed broccoli (1 carb serving) drizzled with 1 tsp olive oil and lemon juice.  1 cup light pudding (2 carb servings). DIABETES MEAL PLANNING WORKSHEET Your dietician can use this worksheet to help you decide how many servings of foods and what types of foods are right for you.  BREAKFAST Food Group and Servings / Carb Servings Grain/Starches __________________________________ Dairy __________________________________________ Vegetable ______________________________________ Fruit ___________________________________________ Meat __________________________________________ Fat ____________________________________________ LUNCH Food Group and Servings / Carb Servings Grain/Starches ___________________________________ Dairy ___________________________________________ Fruit ____________________________________________ Meat ___________________________________________ Fat _____________________________________________ Laural GoldenINNER Food Group and Servings / Carb Servings Grain/Starches ___________________________________ Dairy ___________________________________________ Fruit ____________________________________________ Meat  ___________________________________________ Fat _____________________________________________ SNACKS Food Group and Servings / Carb Servings Grain/Starches ___________________________________ Dairy ___________________________________________ Vegetable _______________________________________ Fruit ____________________________________________ Meat ___________________________________________ Fat _____________________________________________ DAILY TOTALS Starches _________________________ Vegetable ________________________ Fruit ____________________________ Dairy ____________________________ Meat ____________________________ Fat ______________________________ Document Released: 11/27/2004 Document Revised: 05/25/2011 Document Reviewed: 10/08/2008 [employer] Patient Information 2014 [employer].  Monitoring for Diabetes There are two blood tests that help you monitor and manage your diabetes. These include:  An A1c (hemoglobin A1c) test.  This test is an average of your glucose (or blood sugar) control over the past 3 months.  This is recommended as a way for you and your caregiver to understand how well your glucose levels are controlled on the average.  Your A1c goal will be determined by your caregiver, but it is usually best if it is less than 6.5% to 7.0%.  Glucose (sugar) attaches itself to red blood cells. The amount of glucose then can then be measured. The amount of glucose on the cells depends  on how high your blood glucose has been.  SMBG test (self-monitoring blood glucose).  Using a blood glucose monitor (meter) to do SMBG testing is an easy way to monitor the amount of glucose in your blood and can help you improve your control. The monitor will tell you what your blood glucose is at that very moment. Every person with diabetes should have a blood glucose monitor and know how to use it. The better you control your blood sugar on a daily basis, the better your A1c levels  will be. HOW OFTEN SHOULD I HAVE AN A1C LEVEL?  Every 3 months if your diabetes is not well controlled or if therapy has changed.  Every 6 months if you are meeting your treatment goals. HOW OFTEN SHOULD I DO SMBG TESTING?  Your caregiver will recommend how often you should test. Testing times are based on the kind of medicine you take, type of diabetes you have, and your blood glucose control. Testing times can include:  Type 1 diabetes: test 3 or 4 times a day or as directed.  Type 2 diabetes and if you are taking insulin and diabetes pills: test 3 or 4 times a day or as directed.  If you are taking diabetes pills only and not reaching your target A1c: test 2 to 4 times a day or as directed.  If you are taking diabetes pills and are controlling your diabetes well with diet and exercise, your caregiver will help you decide what is appropriate. WHAT TIME OF DAY SHOULD I TEST?  The best time of day to test your blood glucose depends on medications, mealtimes, exercise, and blood glucose control. It is best to test at different times because this will help you know how you are doing throughout the day. Your caregiver will help you decide what is best. WHAT SHOULD MY BLOOD GLUCOSE BE? Blood glucose target goals may vary depending on each persons needs, whether they have type 1 or type 2 diabetes or what medications they are taking. However, as a general rule, blood glucose should be:  Before meals   70-130 mg/dl.  After meals    ..less than 180 mg/dl. CHECK YOUR BLOOD GLUCOSE IF:  You have symptoms of low blood sugar (hypoglycemia), which may include dizziness, shaking, sweating, chills and confusion.  You have symptoms of high blood sugar (hyperglycemia), which may include sleepiness, blurred vision, frequent urination and excessive thirst.  You are learning how meals, physical activity and medicine affect your blood glucose level. The more you learn about how various foods, your  medications, and activities affect you, the better job you will do of taking care of yourself.  You have a job in which poor control could cause safety problems while driving or operating machinery. CHECK YOUR BLOOD SUGAR MORE FREQUENTLY:  If you have medication or dietary changes.  If you begin taking other kinds of medicines.  If you become sick or your level of stress increases. With an illness, your blood sugar may even be high without eating.  Before and after exercise. Follow your caregiver's testing recommendations during this time.  TO DISPOSE OF SHARPS: Each city or state may have different regulations. Check with your public works or Engineer, structural.  Sharps containers can be purchased from pharmacies.  Place all used sharps in a container. You do not need to replace any protective covers over the needle or break the needle.  Sharps should be contained in a ridge, leakproof, puncture-resistant container.  Plastic detergent bottle.  Bleach bottle.  When container is almost full, add a solution that is 1 part laundry bleach and 9 parts tap water (it is okay to use undiluted bleach if you wish). You may want to wear gloves since bleach can damage tissue. Let the solution sit for 30 minutes.  Carefully pour all the liquid into the sanitary sewer. Be sure to prevent the sharps from falling out.  Once liquid is drained, reseal the container with lid and tape it shut with duct tape. This will prevent the cap from coming off.  Dispose of the container with your regular household trash and waste. It is a good idea to let your trash hauler know that you will be disposing of sharps. Document Released: 03/05/2003 Document Revised: 11/25/2011 Document Reviewed: 09/03/2008 [facility] Patient Information 2014 [place], [place].

## 2013-04-12 NOTE — ED Provider Notes (Signed)
CSN: [id number]     Arrival date & time 04/12/13  1421 History   First MD Initiated Contact with Patient 04/12/13 1429     Chief Complaint  Patient presents with  . Medication Refill   (Consider location/radiation/quality/duration/timing/severity/associated sxs/prior Treatment) HPI Comments: Patient here with request for refills of diabetic medication - she reports that she normally takes glipizide 5mg  po daily and metformin 850 mg po BID.  She states that she ran out of the glipizde yesterday and still has about 1 days worth of the metformin.  She denies increase in thirst, polyuria, polyphagia, fever, chills, abdominal pain, nausea or vomiting.  She does not want us to check her blood sugar at this time and reports home readings in the 200's  The history is provided by the patient. No language interpreter was used.    Past Medical History  Diagnosis Date  . Diabetes mellitus without complication    History reviewed. No pertinent past surgical history. History reviewed. No pertinent family history. History  Substance Use Topics  . Smoking status: Never Smoker   . Smokeless tobacco: Not on file  . Alcohol Use: Yes   OB History   Grav Para Term Preterm Abortions TAB SAB Ect Mult Living                 Review of Systems  All other systems reviewed and are negative.    Allergies  Review of patient's allergies indicates no known allergies.  Home Medications   Current Outpatient Rx  Name  Route  Sig  Dispense  Refill  . glipiZIDE (GLUCOTROL) 5 MG tablet   Oral   Take 1 tablet (5 mg total) by mouth daily.   30 tablet   0   . metFORMIN (GLUCOPHAGE) 500 MG tablet   Oral   Take 1 tablet (500 mg total) by mouth 2 (two) times daily with a meal.   60 tablet   2    BP 139/99  Temp(Src) 98.3 F (36.8 C)  Resp 16  Ht 5\' 9"  (1.753 m)  Wt 250 lb (113.399 kg)  BMI 36.90 kg/m2  SpO2 100%  LMP 03/29/2013 Physical Exam  Nursing note and vitals reviewed. Constitutional:  She is oriented to person, place, and time. She appears well-developed and well-nourished. No distress.  HENT:  Head: Normocephalic and atraumatic.  Mouth/Throat: Oropharynx is clear and moist.  Eyes: Conjunctivae are normal. No scleral icterus.  Pulmonary/Chest: Effort normal.  Musculoskeletal: Normal range of motion. She exhibits no edema and no tenderness.  Neurological: She is alert and oriented to person, place, and time. She exhibits normal muscle tone. Coordination normal.  Skin: Skin is warm and dry. No rash noted. No erythema. No pallor.  Psychiatric: She has a normal mood and affect. Her behavior is normal. Judgment and thought content normal.    ED Course  Procedures (including critical care time) Labs Review Labs Reviewed - No data to display Imaging Review No results found.  EKG Interpretation   None       Medical refill  Patient with history of DM Type 2, is stable on metformin and glipizide presents without symptoms of hyperglycemia, DKA, non-ketotic hyperosmolar acidosis.  I will refill her medications - she does not wish for us to check a POC CBG.    Izola PriceFrances C. Marisue HumbleSanford, PA-C 04/12/13 1520

## 2013-04-12 NOTE — ED Notes (Signed)
Pt is here for medication refill for her diabetes.  States she can not see her PCP until May when her insurance becomes active.  [facility]

## 2013-04-12 NOTE — ED Provider Notes (Signed)
Medical screening examination/treatment/procedure(s) were performed by non-physician practitioner and as supervising physician I was immediately available for consultation/collaboration.  EKG Interpretation   None         Charles B. Bernette MayersSheldon, MD 04/12/13 2218

## 2014-02-15 ENCOUNTER — Encounter (HOSPITAL_BASED_OUTPATIENT_CLINIC_OR_DEPARTMENT_OTHER): Payer: Self-pay | Admitting: *Deleted

## 2014-02-15 ENCOUNTER — Emergency Department (HOSPITAL_BASED_OUTPATIENT_CLINIC_OR_DEPARTMENT_OTHER)
Admission: EM | Admit: 2014-02-15 | Discharge: 2014-02-15 | Disposition: A | Discharge status: Home / Self Care | Payer: Self-pay | Attending: Emergency Medicine | Admitting: Emergency Medicine

## 2014-02-15 DIAGNOSIS — Z79899 Other long term (current) drug therapy: Secondary | ICD-10-CM | POA: Insufficient documentation

## 2014-02-15 DIAGNOSIS — M5412 Radiculopathy, cervical region: Secondary | ICD-10-CM | POA: Insufficient documentation

## 2014-02-15 DIAGNOSIS — E119 Type 2 diabetes mellitus without complications: Secondary | ICD-10-CM | POA: Insufficient documentation

## 2014-02-15 MED ORDER — IBUPROFEN 800 MG PO TABS: 800.0000 mg | ORAL_TABLET | Freq: Three times a day (TID) | ORAL | Status: DC

## 2014-02-15 NOTE — ED Provider Notes (Signed)
CSN: [id number]     Arrival date & time 02/15/14  1151 History   First MD Initiated Contact with Patient 02/15/14 1211     Chief Complaint  Patient presents with  . Neck Pain     (Consider location/radiation/quality/duration/timing/severity/associated sxs/prior Treatment) HPI Comments: Patient presents to the emergency department with chief complaints of right shoulder and neck pain 2 weeks. Patient reports associated radiating pain to her right arm. She reports associated numbness and tingling in her fourth and fifth fingers. She states that the symptoms started 2 weeks ago when she started working out the gym. She also complains that her symptoms are worse when she carries her heavy book bag on her right shoulder. She has tried taking ibuprofen with minimal relief. She denies any other mechanism of injury. Her symptoms are relieved with rest and with putting her hand on her head.  The history is provided by the patient. No language interpreter was used.    Past Medical History  Diagnosis Date  . Diabetes mellitus without complication    History reviewed. No pertinent past surgical history. History reviewed. No pertinent family history. History  Substance Use Topics  . Smoking status: Never Smoker   . Smokeless tobacco: Not on file  . Alcohol Use: Yes   OB History    No data available     Review of Systems  Constitutional: Negative for fever and chills.  Respiratory: Negative for shortness of breath.   Cardiovascular: Negative for chest pain.  Gastrointestinal: Negative for nausea, vomiting, diarrhea and constipation.  Genitourinary: Negative for dysuria.  Neurological:       Tingling of fourth and fifth fingers on the right hand      Allergies  Review of patient's allergies indicates no known allergies.  Home Medications   Prior to Admission medications   Medication Sig Start Date End Date Taking? Authorizing Provider  Albiglutide (TANZEUM) 30 MG PEN Inject into  the skin once a week.   Yes Historical Provider, MD  glipiZIDE (GLUCOTROL) 5 MG tablet Take 1 tablet (5 mg total) by mouth daily. 04/12/13   Izola PriceFrances C. Sanford, PA-C  ibuprofen (ADVIL,MOTRIN) 800 MG tablet Take 1 tablet (800 mg total) by mouth 3 (three) times daily. 02/15/14   Roxy Horsemanobert Chey Cho, PA-C  metFORMIN (GLUCOPHAGE) 850 MG tablet Take 1 tablet (850 mg total) by mouth 2 (two) times daily with a meal. 04/12/13   Izola PriceFrances C. Sanford, PA-C   BP 147/95 mmHg  Pulse 92  Temp(Src) 98.4 F (36.9 C) (Oral)  Resp 16  Ht 5\' 9"  (1.753 m)  Wt 250 lb (113.399 kg)  BMI 36.90 kg/m2  SpO2 100%  LMP 02/05/2014 Physical Exam  Constitutional: She is oriented to person, place, and time. She appears well-developed and well-nourished. No distress.  HENT:  Head: Normocephalic and atraumatic.  Eyes: Conjunctivae and EOM are normal. Right eye exhibits no discharge. Left eye exhibits no discharge. No scleral icterus.  Neck: Normal range of motion. Neck supple. No tracheal deviation present.  Cardiovascular: Normal rate, regular rhythm and normal heart sounds.  Exam reveals no gallop and no friction rub.   No murmur heard. Pulmonary/Chest: Effort normal and breath sounds normal. No respiratory distress. She has no wheezes.  Abdominal: Soft. She exhibits no distension. There is no tenderness.  Musculoskeletal: Normal range of motion.  No cervical paraspinal muscles tenderness to palpation, no bony tenderness, step-offs, or gross abnormality or deformity of spine, patient is able to ambulate, moves all extremities  Moderate tenderness  to palpation of the upper right trapezius  Bilateral great toe extension intact Bilateral plantar/dorsiflexion intact  Neurological: She is alert and oriented to person, place, and time. She has normal reflexes.  Sensation and strength intact bilaterally Symmetrical reflexes  Skin: Skin is warm. She is not diaphoretic.  Psychiatric: She has a normal mood and affect. Her behavior  is normal. Judgment and thought content normal.  Nursing note and vitals reviewed.   ED Course  Procedures (including critical care time) Labs Review Labs Reviewed - No data to display  Imaging Review No results found.   EKG Interpretation None      MDM   Final diagnoses:  Radiculopathy of cervical region    Patient with radicular pain to the right arm. Likely from carrying heavy backpack on right shoulder. Pain is relieved when right hand is placed on head, positive Spurling, patient is neurovascularly intact. Recommend rice, NSAIDs, and physical therapy. Recommend primary care/orthopedic follow-up in 2 weeks if no better. Patient understands agrees the plan. She is stable and ready for discharge.    Roxy Horsemanobert Zarion Oliff, PA-C 02/15/14 1245  Elwin MochaBlair Walden, MD 02/15/14 [phone]

## 2014-02-15 NOTE — Discharge Instructions (Signed)
Cervical Radiculopathy Cervical radiculopathy happens when a nerve in the neck is pinched or bruised by a slipped (herniated) disk or by arthritic changes in the bones of the cervical spine. This can occur due to an injury or as part of the normal aging process. Pressure on the cervical nerves can cause pain or numbness that runs from your neck all the way down into your arm and fingers. CAUSES  There are many possible causes, including:  Injury.  Muscle tightness in the neck from overuse.  Swollen, painful joints (arthritis).  Breakdown or degeneration in the bones and joints of the spine (spondylosis) due to aging.  Bone spurs that may develop near the cervical nerves. SYMPTOMS  Symptoms include pain, weakness, or numbness in the affected arm and hand. Pain can be severe or irritating. Symptoms may be worse when extending or turning the neck. DIAGNOSIS  Your caregiver will ask about your symptoms and do a physical exam. He or she may test your strength and reflexes. X-rays, CT scans, and MRI scans may be needed in cases of injury or if the symptoms do not go away after a period of time. Electromyography (EMG) or nerve conduction testing may be done to study how your nerves and muscles are working. TREATMENT  Your caregiver may recommend certain exercises to help relieve your symptoms. Cervical radiculopathy can, and often does, get better with time and treatment. If your problems continue, treatment options may include:  Wearing a soft collar for short periods of time.  Physical therapy to strengthen the neck muscles.  Medicines, such as nonsteroidal anti-inflammatory drugs (NSAIDs), oral corticosteroids, or spinal injections.  Surgery. Different types of surgery may be done depending on the cause of your problems. HOME CARE INSTRUCTIONS   Put ice on the affected area.  Put ice in a plastic bag.  Place a towel between your skin and the bag.  Leave the ice on for 15-20 minutes,  03-04 times a day or as directed by your caregiver.  If ice does not help, you can try using heat. Take a warm shower or bath, or use a hot water bottle as directed by your caregiver.  You may try a gentle neck and shoulder massage.  Use a flat pillow when you sleep.  Only take over-the-counter or prescription medicines for pain, discomfort, or fever as directed by your caregiver.  If physical therapy was prescribed, follow your caregiver's directions.  If a soft collar was prescribed, use it as directed. SEEK IMMEDIATE MEDICAL CARE IF:   Your pain gets much worse and cannot be controlled with medicines.  You have weakness or numbness in your hand, arm, face, or leg.  You have a high fever or a stiff, rigid neck.  You lose bowel or bladder control (incontinence).  You have trouble with walking, balance, or speaking. MAKE SURE YOU:   Understand these instructions. Will watch your condition.   Impingement Syndrome, Rotator Cuff, Bursitis with Rehab Impingement syndrome is a condition that involves inflammation of the tendons of the rotator cuff and the subacromial bursa, that causes pain in the shoulder. The rotator cuff consists of four tendons and muscles that control much of the shoulder and upper arm function. The subacromial bursa is a fluid filled sac that helps reduce friction between the rotator cuff and one of the bones of the shoulder (acromion). Impingement syndrome is usually an overuse injury that causes swelling of the bursa (bursitis), swelling of the tendon (tendonitis), and/or a tear of the  tendon (strain). Strains are classified into three categories. Grade 1 strains cause pain, but the tendon is not lengthened. Grade 2 strains include a lengthened ligament, due to the ligament being stretched or partially ruptured. With grade 2 strains there is still function, although the function may be decreased. Grade 3 strains include a complete tear of the tendon or muscle, and  function is usually impaired. SYMPTOMS   Pain around the shoulder, often at the outer portion of the upper arm.  Pain that gets worse with shoulder function, especially when reaching overhead or lifting.  Sometimes, aching when not using the arm.  Pain that wakes you up at night.  Sometimes, tenderness, swelling, warmth, or redness over the affected area.  Loss of strength.  Limited motion of the shoulder, especially reaching behind the back (to the back pocket or to unhook bra) or across your body.  Crackling sound (crepitation) when moving the arm.  Biceps tendon pain and inflammation (in the front of the shoulder). Worse when bending the elbow or lifting. CAUSES  Impingement syndrome is often an overuse injury, in which chronic (repetitive) motions cause the tendons or bursa to become inflamed. A strain occurs when a force is paced on the tendon or muscle that is greater than it can withstand. Common mechanisms of injury include: Stress from sudden increase in duration, frequency, or intensity of training.  Direct hit (trauma) to the shoulder.  Aging, erosion of the tendon with normal use.  Bony bump on shoulder (acromial spur). RISK INCREASES WITH:  Contact sports (football, wrestling, boxing).  Throwing sports (baseball, tennis, volleyball).  Weightlifting and bodybuilding.  Heavy labor.  Previous injury to the rotator cuff, including impingement.  Poor shoulder strength and flexibility.  Failure to warm up properly before activity.  Inadequate protective equipment.  Old age.  Bony bump on shoulder (acromial spur). PREVENTION   Warm up and stretch properly before activity.  Allow for adequate recovery between workouts.  Maintain physical fitness:  Strength, flexibility, and endurance.  Cardiovascular fitness.  Learn and use proper exercise technique. PROGNOSIS  If treated properly, impingement syndrome usually goes away within 6 weeks. Sometimes  surgery is required.  RELATED COMPLICATIONS   Longer healing time if not properly treated, or if not given enough time to heal.  Recurring symptoms, that result in a chronic condition.  Shoulder stiffness, frozen shoulder, or loss of motion.  Rotator cuff tendon tear.  Recurring symptoms, especially if activity is resumed too soon, with overuse, with a direct blow, or when using poor technique. TREATMENT  Treatment first involves the use of ice and medicine, to reduce pain and inflammation. The use of strengthening and stretching exercises may help reduce pain with activity. These exercises may be performed at home or with a therapist. If non-surgical treatment is unsuccessful after more than 6 months, surgery may be advised. After surgery and rehabilitation, activity is usually possible in 3 months.  MEDICATION  If pain medicine is needed, nonsteroidal anti-inflammatory medicines (aspirin and ibuprofen), or other minor pain relievers (acetaminophen), are often advised.  Do not take pain medicine for 7 days before surgery.  Prescription pain relievers may be given, if your caregiver thinks they are needed. Use only as directed and only as much as you need.  Corticosteroid injections may be given by your caregiver. These injections should be reserved for the most serious cases, because they may only be given a certain number of times. HEAT AND COLD  Cold treatment (icing) should be applied  for 10 to 15 minutes every 2 to 3 hours for inflammation and pain, and immediately after activity that aggravates your symptoms. Use ice packs or an ice massage.  Heat treatment may be used before performing stretching and strengthening activities prescribed by your caregiver, physical therapist, or athletic trainer. Use a heat pack or a warm water soak. SEEK MEDICAL CARE IF:   Symptoms get worse or do not improve in 4 to 6 weeks, despite treatment.  New, unexplained symptoms develop. (Drugs used in  treatment may produce side effects.) EXERCISES  RANGE OF MOTION (ROM) AND STRETCHING EXERCISES - Impingement Syndrome (Rotator Cuff  Tendinitis, Bursitis) These exercises may help you when beginning to rehabilitate your injury. Your symptoms may go away with or without further involvement from your physician, physical therapist or athletic trainer. While completing these exercises, remember:   Restoring tissue flexibility helps normal motion to return to the joints. This allows healthier, less painful movement and activity.  An effective stretch should be held for at least 30 seconds.  A stretch should never be painful. You should only feel a gentle lengthening or release in the stretched tissue. STRETCH - Flexion, Standing  Stand with good posture. With an underhand grip on your right / left hand, and an overhand grip on the opposite hand, grasp a broomstick or cane so that your hands are a little more than shoulder width apart.  Keeping your right / left elbow straight and shoulder muscles relaxed, push the stick with your opposite hand, to raise your right / left arm in front of your body and then overhead. Raise your arm until you feel a stretch in your right / left shoulder, but before you have increased shoulder pain.  Try to avoid shrugging your right / left shoulder as your arm rises, by keeping your shoulder blade tucked down and toward your mid-back spine. Hold for __________ seconds.  Slowly return to the starting position. Repeat __________ times. Complete this exercise __________ times per day. STRETCH - Abduction, Supine  Lie on your back. With an underhand grip on your right / left hand and an overhand grip on the opposite hand, grasp a broomstick or cane so that your hands are a little more than shoulder width apart.  Keeping your right / left elbow straight and your shoulder muscles relaxed, push the stick with your opposite hand, to raise your right / left arm out to the  side of your body and then overhead. Raise your arm until you feel a stretch in your right / left shoulder, but before you have increased shoulder pain.  Try to avoid shrugging your right / left shoulder as your arm rises, by keeping your shoulder blade tucked down and toward your mid-back spine. Hold for __________ seconds.  Slowly return to the starting position. Repeat __________ times. Complete this exercise __________ times per day. ROM - Flexion, Active-Assisted  Lie on your back. You may bend your knees for comfort.  Grasp a broomstick or cane so your hands are about shoulder width apart. Your right / left hand should grip the end of the stick, so that your hand is positioned "thumbs-up," as if you were about to shake hands.  Using your healthy arm to lead, raise your right / left arm overhead, until you feel a gentle stretch in your shoulder. Hold for __________ seconds.  Use the stick to assist in returning your right / left arm to its starting position. Repeat __________ times. Complete this exercise  __________ times per day.  ROM - Internal Rotation, Supine   Lie on your back on a firm surface. Place your right / left elbow about 60 degrees away from your side. Elevate your elbow with a folded towel, so that the elbow and shoulder are the same height.  Using a broomstick or cane and your strong arm, pull your right / left hand toward your body until you feel a gentle stretch, but no increase in your shoulder pain. Keep your shoulder and elbow in place throughout the exercise.  Hold for __________ seconds. Slowly return to the starting position. Repeat __________ times. Complete this exercise __________ times per day. STRETCH - Internal Rotation  Place your right / left hand behind your back, palm up.  Throw a towel or belt over your opposite shoulder. Grasp the towel with your right / left hand.  While keeping an upright posture, gently pull up on the towel, until you feel a  stretch in the front of your right / left shoulder.  Avoid shrugging your right / left shoulder as your arm rises, by keeping your shoulder blade tucked down and toward your mid-back spine.  Hold for __________ seconds. Release the stretch, by lowering your healthy hand. Repeat __________ times. Complete this exercise __________ times per day. ROM - Internal Rotation   Using an underhand grip, grasp a stick behind your back with both hands.  While standing upright with good posture, slide the stick up your back until you feel a mild stretch in the front of your shoulder.  Hold for __________ seconds. Slowly return to your starting position. Repeat __________ times. Complete this exercise __________ times per day.  STRETCH - Posterior Shoulder Capsule   Stand or sit with good posture. Grasp your right / left elbow and draw it across your chest, keeping it at the same height as your shoulder.  Pull your elbow, so your upper arm comes in closer to your chest. Pull until you feel a gentle stretch in the back of your shoulder.  Hold for __________ seconds. Repeat __________ times. Complete this exercise __________ times per day. STRENGTHENING EXERCISES - Impingement Syndrome (Rotator Cuff Tendinitis, Bursitis) These exercises may help you when beginning to rehabilitate your injury. They may resolve your symptoms with or without further involvement from your physician, physical therapist or athletic trainer. While completing these exercises, remember:  Muscles can gain both the endurance and the strength needed for everyday activities through controlled exercises.  Complete these exercises as instructed by your physician, physical therapist or athletic trainer. Increase the resistance and repetitions only as guided.  You may experience muscle soreness or fatigue, but the pain or discomfort you are trying to eliminate should never worsen during these exercises. If this pain does get worse, stop  and make sure you are following the directions exactly. If the pain is still present after adjustments, discontinue the exercise until you can discuss the trouble with your clinician.  During your recovery, avoid activity or exercises which involve actions that place your injured hand or elbow above your head or behind your back or head. These positions stress the tissues which you are trying to heal. STRENGTH - Scapular Depression and Adduction   With good posture, sit on a firm chair. Support your arms in front of you, with pillows, arm rests, or on a table top. Have your elbows in line with the sides of your body.  Gently draw your shoulder blades down and toward your mid-back spine.  Gradually increase the tension, without tensing the muscles along the top of your shoulders and the back of your neck.  Hold for __________ seconds. Slowly release the tension and relax your muscles completely before starting the next repetition.  After you have practiced this exercise, remove the arm support and complete the exercise in standing as well as sitting position. Repeat __________ times. Complete this exercise __________ times per day.  STRENGTH - Shoulder Abductors, Isometric  With good posture, stand or sit about 4-6 inches from a wall, with your right / left side facing the wall.  Bend your right / left elbow. Gently press your right / left elbow into the wall. Increase the pressure gradually, until you are pressing as hard as you can, without shrugging your shoulder or increasing any shoulder discomfort.  Hold for __________ seconds.  Release the tension slowly. Relax your shoulder muscles completely before you begin the next repetition. Repeat __________ times. Complete this exercise __________ times per day.  STRENGTH - External Rotators, Isometric  Keep your right / left elbow at your side and bend it 90 degrees.  Step into a door frame so that the outside of your right / left wrist can  press against the door frame without your upper arm leaving your side.  Gently press your right / left wrist into the door frame, as if you were trying to swing the back of your hand away from your stomach. Gradually increase the tension, until you are pressing as hard as you can, without shrugging your shoulder or increasing any shoulder discomfort.  Hold for __________ seconds.  Release the tension slowly. Relax your shoulder muscles completely before you begin the next repetition. Repeat __________ times. Complete this exercise __________ times per day.  STRENGTH - Supraspinatus   Stand or sit with good posture. Grasp a __________ weight, or an exercise band or tubing, so that your hand is "thumbs-up," like you are shaking hands.  Slowly lift your right / left arm in a "V" away from your thigh, diagonally into the space between your side and straight ahead. Lift your hand to shoulder height or as far as you can, without increasing any shoulder pain. At first, many people do not lift their hands above shoulder height.  Avoid shrugging your right / left shoulder as your arm rises, by keeping your shoulder blade tucked down and toward your mid-back spine.  Hold for __________ seconds. Control the descent of your hand, as you slowly return to your starting position. Repeat __________ times. Complete this exercise __________ times per day.  STRENGTH - External Rotators  Secure a rubber exercise band or tubing to a fixed object (table, pole) so that it is at the same height as your right / left elbow when you are standing or sitting on a firm surface.  Stand or sit so that the secured exercise band is at your uninjured side.  Bend your right / left elbow 90 degrees. Place a folded towel or small pillow under your right / left arm, so that your elbow is a few inches away from your side.  Keeping the tension on the exercise band, pull it away from your body, as if pivoting on your elbow. Be sure  to keep your body steady, so that the movement is coming only from your rotating shoulder.  Hold for __________ seconds. Release the tension in a controlled manner, as you return to the starting position. Repeat __________ times. Complete this exercise __________ times per  day.  STRENGTH - Internal Rotators   Secure a rubber exercise band or tubing to a fixed object (table, pole) so that it is at the same height as your right / left elbow when you are standing or sitting on a firm surface.  Stand or sit so that the secured exercise band is at your right / left side.  Bend your elbow 90 degrees. Place a folded towel or small pillow under your right / left arm so that your elbow is a few inches away from your side.  Keeping the tension on the exercise band, pull it across your body, toward your stomach. Be sure to keep your body steady, so that the movement is coming only from your rotating shoulder.  Hold for __________ seconds. Release the tension in a controlled manner, as you return to the starting position. Repeat __________ times. Complete this exercise __________ times per day.  STRENGTH - Scapular Protractors, Standing   Stand arms length away from a wall. Place your hands on the wall, keeping your elbows straight.  Begin by dropping your shoulder blades down and toward your mid-back spine.  To strengthen your protractors, keep your shoulder blades down, but slide them forward on your rib cage. It will feel as if you are lifting the back of your rib cage away from the wall. This is a subtle motion and can be challenging to complete. Ask your caregiver for further instruction, if you are not sure you are doing the exercise correctly.  Hold for __________ seconds. Slowly return to the starting position, resting the muscles completely before starting the next repetition. Repeat __________ times. Complete this exercise __________ times per day. STRENGTH - Scapular Protractors,  Supine  Lie on your back on a firm surface. Extend your right / left arm straight into the air while holding a __________ weight in your hand.  Keeping your head and back in place, lift your shoulder off the floor.  Hold for __________ seconds. Slowly return to the starting position, and allow your muscles to relax completely before starting the next repetition. Repeat __________ times. Complete this exercise __________ times per day. STRENGTH - Scapular Protractors, Quadruped  Get onto your hands and knees, with your shoulders directly over your hands (or as close as you can be, comfortably).  Keeping your elbows locked, lift the back of your rib cage up into your shoulder blades, so your mid-back rounds out. Keep your neck muscles relaxed.  Hold this position for __________ seconds. Slowly return to the starting position and allow your muscles to relax completely before starting the next repetition. Repeat __________ times. Complete this exercise __________ times per day.  STRENGTH - Scapular Retractors  Secure a rubber exercise band or tubing to a fixed object (table, pole), so that it is at the height of your shoulders when you are either standing, or sitting on a firm armless chair.  With a palm down grip, grasp an end of the band in each hand. Straighten your elbows and lift your hands straight in front of you, at shoulder height. Step back, away from the secured end of the band, until it becomes tense.  Squeezing your shoulder blades together, draw your elbows back toward your sides, as you bend them. Keep your upper arms lifted away from your body throughout the exercise.  Hold for __________ seconds. Slowly ease the tension on the band, as you reverse the directions and return to the starting position. Repeat __________ times. Complete this exercise  __________ times per day. STRENGTH - Shoulder Extensors   Secure a rubber exercise band or tubing to a fixed object (table, pole) so  that it is at the height of your shoulders when you are either standing, or sitting on a firm armless chair.  With a thumbs-up grip, grasp an end of the band in each hand. Straighten your elbows and lift your hands straight in front of you, at shoulder height. Step back, away from the secured end of the band, until it becomes tense.  Squeezing your shoulder blades together, pull your hands down to the sides of your thighs. Do not allow your hands to go behind you.  Hold for __________ seconds. Slowly ease the tension on the band, as you reverse the directions and return to the starting position. Repeat __________ times. Complete this exercise __________ times per day.  STRENGTH - Scapular Retractors and External Rotators   Secure a rubber exercise band or tubing to a fixed object (table, pole) so that it is at the height as your shoulders, when you are either standing, or sitting on a firm armless chair.  With a palm down grip, grasp an end of the band in each hand. Bend your elbows 90 degrees and lift your elbows to shoulder height, at your sides. Step back, away from the secured end of the band, until it becomes tense.  Squeezing your shoulder blades together, rotate your shoulders so that your upper arms and elbows remain stationary, but your fists travel upward to head height.  Hold for __________ seconds. Slowly ease the tension on the band, as you reverse the directions and return to the starting position. Repeat __________ times. Complete this exercise __________ times per day.  STRENGTH - Scapular Retractors and External Rotators, Rowing   Secure a rubber exercise band or tubing to a fixed object (table, pole) so that it is at the height of your shoulders, when you are either standing, or sitting on a firm armless chair.  With a palm down grip, grasp an end of the band in each hand. Straighten your elbows and lift your hands straight in front of you, at shoulder height. Step back, away  from the secured end of the band, until it becomes tense.  Step 1: Squeeze your shoulder blades together. Bending your elbows, draw your hands to your chest, as if you are rowing a boat. At the end of this motion, your hands and elbow should be at shoulder height and your elbows should be out to your sides.  Step 2: Rotate your shoulders, to raise your hands above your head. Your forearms should be vertical and your upper arms should be horizontal.  Hold for __________ seconds. Slowly ease the tension on the band, as you reverse the directions and return to the starting position. Repeat __________ times. Complete this exercise __________ times per day.  STRENGTH - Scapular Depressors  Find a sturdy chair without wheels, such as a dining room chair.  Keeping your feet on the floor, and your hands on the chair arms, lift your bottom up from the seat, and lock your elbows.  Keeping your elbows straight, allow gravity to pull your body weight down. Your shoulders will rise toward your ears.  Raise your body against gravity by drawing your shoulder blades down your back, shortening the distance between your shoulders and ears. Although your feet should always maintain contact with the floor, your feet should progressively support less body weight, as you get stronger.  Hold for __________ seconds. In a controlled and slow manner, lower your body weight to begin the next repetition. Repeat __________ times. Complete this exercise __________ times per day.  Document Released: 03/02/2005 Document Revised: 05/25/2011 Document Reviewed: 06/14/2008 [facility] Patient Information 2015 [place], [place]. This information is not intended to replace advice given to you by your health care provider. Make sure you discuss any questions you have with your health care provider.    Will get help right away if you are not doing well or get worse. Document Released: 11/25/2000 Document Revised: 05/25/2011 Document  Reviewed: 10/14/2010 [facility] Patient Information 2015 [place], [place]. This information is not intended to replace advice given to you by your health care provider. Make sure you discuss any questions you have with your health care provider.

## 2014-02-15 NOTE — ED Notes (Signed)
Pt amb to room 6 with quick steady gait in nad. Pt reports neck and right shoulder pain x 2 weeks, pt believes it is caused by having to carry her school books. Pt states her fingers tingling and numb at times.

## 2014-10-19 ENCOUNTER — Encounter (HOSPITAL_BASED_OUTPATIENT_CLINIC_OR_DEPARTMENT_OTHER): Payer: Self-pay

## 2014-10-19 ENCOUNTER — Emergency Department (HOSPITAL_BASED_OUTPATIENT_CLINIC_OR_DEPARTMENT_OTHER)
Admission: EM | Admit: 2014-10-19 | Discharge: 2014-10-19 | Disposition: A | Discharge status: Home / Self Care | Payer: No Typology Code available for payment source | Attending: Emergency Medicine | Admitting: Emergency Medicine

## 2014-10-19 DIAGNOSIS — Z791 Long term (current) use of non-steroidal anti-inflammatories (NSAID): Secondary | ICD-10-CM | POA: Diagnosis not present

## 2014-10-19 DIAGNOSIS — Z79899 Other long term (current) drug therapy: Secondary | ICD-10-CM | POA: Insufficient documentation

## 2014-10-19 DIAGNOSIS — E119 Type 2 diabetes mellitus without complications: Secondary | ICD-10-CM | POA: Diagnosis not present

## 2014-10-19 DIAGNOSIS — M25511 Pain in right shoulder: Secondary | ICD-10-CM | POA: Diagnosis present

## 2014-10-19 MED ORDER — IBUPROFEN 800 MG PO TABS: 800.0000 mg | ORAL_TABLET | Freq: Three times a day (TID) | ORAL | Status: AC | PRN

## 2014-10-19 MED ORDER — HYDROCODONE-ACETAMINOPHEN 5-325 MG PO TABS: 1.0000 | ORAL_TABLET | Freq: Four times a day (QID) | ORAL | Status: DC | PRN

## 2014-10-19 MED ORDER — IBUPROFEN 800 MG PO TABS
800.0000 mg | ORAL_TABLET | Freq: Once | ORAL | Status: AC
  Administered 2014-10-19: 800 mg via ORAL
  Filled 2014-10-19: qty 1

## 2014-10-19 MED ORDER — HYDROCODONE-ACETAMINOPHEN 5-325 MG PO TABS
2.0000 | ORAL_TABLET | Freq: Once | ORAL | Status: AC
  Administered 2014-10-19: 2 via ORAL
  Filled 2014-10-19: qty 2

## 2014-10-19 NOTE — ED Notes (Signed)
Pt c/o rt shoulder pain x2 days, denies injury, states taking advil with no relief

## 2014-10-19 NOTE — Discharge Instructions (Signed)
Rotator Cuff Tendinitis  °Rotator cuff tendinitis is inflammation of the tough, cord-like bands that connect muscle to bone (tendons) in your rotator cuff. Your rotator cuff is the collection of all the muscles and tendons that connect your arm to your shoulder. Your rotator cuff holds the head of your upper arm bone (humerus) in the cup (fossa) of your shoulder blade (scapula). °CAUSES °Rotator cuff tendinitis is usually caused by overusing the joint involved.  °SIGNS AND SYMPTOMS °· Deep ache in the shoulder also felt on the outside upper arm over the shoulder muscle. °· Point tenderness over the area that is injured. °· Pain comes on gradually and becomes worse with lifting the arm to the side (abduction) or turning it inward (internal rotation). °· May lead to a chronic tear: When a rotator cuff tendon becomes inflamed, it runs the risk of losing its blood supply, causing some tendon fibers to die. This increases the risk that the tendon can fray and partially or completely tear. °DIAGNOSIS °Rotator cuff tendinitis is diagnosed by taking a medical history, performing a physical exam, and reviewing results of imaging exams. The medical history is useful to help determine the type of rotator cuff injury. The physical exam will include looking at the injured shoulder, feeling the injured area, and watching you do range-of-motion exercises. X-ray exams are typically done to rule out other causes of shoulder pain, such as fractures. MRI is the imaging exam usually used for significant shoulder injuries. Sometimes a dye study called CT arthrogram is done, but it is not as widely used as MRI. In some institutions, special ultrasound tests may also be used to aid in the diagnosis. °TREATMENT  °Less Severe Cases °· Use of a sling to rest the shoulder for a short period of time. Prolonged use of the sling can cause stiffness, weakness, and loss of motion of the shoulder joint. °· Anti-inflammatory medicines, such as  ibuprofen or naproxen sodium, may be prescribed. °More Severe Cases °· Physical therapy. °· Use of steroid injections into the shoulder joint. °· Surgery. °HOME CARE INSTRUCTIONS  °· Use a sling or splint until the pain decreases. Prolonged use of the sling can cause stiffness, weakness, and loss of motion of the shoulder joint. °· Apply ice to the injured area: °¨ Put ice in a plastic bag. °¨ Place a towel between your skin and the bag. °¨ Leave the ice on for 20 minutes, 2-3 times a day. °· Try to avoid use other than gentle range of motion while your shoulder is painful. Use the shoulder and exercise only as directed by your health care provider. Stop exercises or range of motion if pain or discomfort increases, unless directed otherwise by your health care provider. °· Only take over-the-counter or prescription medicines for pain, discomfort, or fever as directed by your health care provider. °· If you were given a shoulder sling and straps (immobilizer), do not remove it except as directed, or until you see a health care provider for a follow-up exam. If you need to remove it, move your arm as little as possible or as directed. °· You may want to sleep on several pillows at night to lessen swelling and pain. °SEEK IMMEDIATE MEDICAL CARE IF:  °· Your shoulder pain increases or new pain develops in your arm, hand, or fingers and is not relieved with medicines. °· You have new, unexplained symptoms, especially increased numbness in the hands or loss of strength. °· You develop any worsening of the problems   that brought you in for care. °· Your arm, hand, or fingers are numb or tingling. °· Your arm, hand, or fingers are swollen, painful, or turn white or blue. °MAKE SURE YOU: °· Understand these instructions. °· Will watch your condition. °· Will get help right away if you are not doing well or get worse. °Document Released: 05/23/2003 Document Revised: 12/21/2012 Document Reviewed: 10/12/2012 °ExitCare® Patient  Information ©2015 [employer], LLC. This information is not intended to replace advice given to you by your health care provider. Make sure you discuss any questions you have with your health care provider. ° ° °RICE: Routine Care for Injuries °The routine care of many injuries includes Rest, Ice, Compression, and Elevation (RICE). °HOME CARE INSTRUCTIONS °· Rest is needed to allow your body to heal. Routine activities can usually be resumed when comfortable. Injured tendons and bones can take up to 6 weeks to heal. Tendons are the cord-like structures that attach muscle to bone. °· Ice following an injury helps keep the swelling down and reduces pain. °¨ Put ice in a plastic bag. °¨ Place a towel between your skin and the bag. °¨ Leave the ice on for 15-20 minutes, 3-4 times a day, or as directed by your health care provider. Do this while awake, for the first 24 to 48 hours. After that, continue as directed by your caregiver. °· Compression helps keep swelling down. It also gives support and helps with discomfort. If an elastic bandage has been applied, it should be removed and reapplied every 3 to 4 hours. It should not be applied tightly, but firmly enough to keep swelling down. Watch fingers or toes for swelling, bluish discoloration, coldness, numbness, or excessive pain. If any of these problems occur, remove the bandage and reapply loosely. Contact your caregiver if these problems continue. °· Elevation helps reduce swelling and decreases pain. With extremities, such as the arms, hands, legs, and feet, the injured area should be placed near or above the level of the heart, if possible. °SEEK IMMEDIATE MEDICAL CARE IF: °· You have persistent pain and swelling. °· You develop redness, numbness, or unexpected weakness. °· Your symptoms are getting worse rather than improving after several days. °These symptoms may indicate that further evaluation or further X-rays are needed. Sometimes, X-rays may not show a small  broken bone (fracture) until 1 week or 10 days later. Make a follow-up appointment with your caregiver. Ask when your X-ray results will be ready. Make sure you get your X-ray results. °Document Released: 06/14/2000 Document Revised: 03/07/2013 Document Reviewed: 08/01/2010 °ExitCare® Patient Information ©2015 [employer], LLC. This information is not intended to replace advice given to you by your health care provider. Make sure you discuss any questions you have with your health care provider. ° ° °

## 2014-10-19 NOTE — ED Notes (Signed)
C/o rt shoulder pain x 2-3 days woke w increased pain this am   Denies inj

## 2014-10-19 NOTE — ED Provider Notes (Signed)
TIME SEEN: 3:35 AM  CHIEF COMPLAINT: Right shoulder pain  HPI: Pt is a 39 y.o. female with history of hypertension, diabetes, hyperlipidemia who presented to the emergency department with complaints of right shoulder pain for the past 2-3 days. Worse with movement and lifting her arm over her head. No history of injury. Has had similar episodes in the past but never seen by a physician for this. Has been taking 400 mg of ibuprofen at home with minimal relief. No fever, erythema or warmth. No numbness, tingling or focal weakness. No neck pain.  ROS: See HPI Constitutional: no fever  Eyes: no drainage  ENT: no runny nose   Cardiovascular:  no chest pain  Resp: no SOB  GI: no vomiting GU: no dysuria Integumentary: no rash  Allergy: no hives  Musculoskeletal: no leg swelling  Neurological: no slurred speech ROS otherwise negative  PAST MEDICAL HISTORY/PAST SURGICAL HISTORY:  Past Medical History  Diagnosis Date  . Diabetes mellitus without complication     MEDICATIONS:  Prior to Admission medications   Medication Sig Start Date End Date Taking? Authorizing Provider  lisinopril (PRINIVIL,ZESTRIL) 10 MG tablet Take 10 mg by mouth daily.   Yes Historical Provider, MD  simvastatin (ZOCOR) 10 MG tablet Take 10 mg by mouth daily.   Yes Historical Provider, MD  Albiglutide (TANZEUM) 30 MG PEN Inject into the skin once a week.    Historical Provider, MD  glipiZIDE (GLUCOTROL) 5 MG tablet Take 1 tablet (5 mg total) by mouth daily. 04/12/13   Cherrie Distance, PA-C  ibuprofen (ADVIL,MOTRIN) 800 MG tablet Take 1 tablet (800 mg total) by mouth 3 (three) times daily. 02/15/14   Roxy Horseman, PA-C  metFORMIN (GLUCOPHAGE) 850 MG tablet Take 1 tablet (850 mg total) by mouth 2 (two) times daily with a meal. 04/12/13   Cherrie Distance, PA-C    ALLERGIES:  No Known Allergies  SOCIAL HISTORY:  History  Substance Use Topics  . Smoking status: Never Smoker   . Smokeless tobacco: Not on file  .  Alcohol Use: Yes    FAMILY HISTORY: No family history on file.  EXAM: BP 117/92 mmHg  Pulse 96  Temp(Src) 98.5 F (36.9 C) (Oral)  Resp 18  Ht 5\' 9"  (1.753 m)  Wt 250 lb (113.399 kg)  BMI 36.90 kg/m2  SpO2 97%  LMP 10/05/2014 CONSTITUTIONAL: Alert and oriented and responds appropriately to questions. Well-appearing; well-nourished HEAD: Normocephalic EYES: Conjunctivae clear, PERRL ENT: normal nose; no rhinorrhea; moist mucous membranes; pharynx without lesions noted NECK: Supple, no meningismus, no LAD; no midline spinal tenderness or step-off or deformity CARD: RRR; S1 and S2 appreciated; no murmurs, no clicks, no rubs, no gallops RESP: Normal chest excursion without splinting or tachypnea; breath sounds clear and equal bilaterally; no wheezes, no rhonchi, no rales, no hypoxia or respiratory distress, speaking full sentences ABD/GI: Normal bowel sounds; non-distended; soft, non-tender, no rebound, no guarding, no peritoneal signs BACK:  The back appears normal and is non-tender to palpation, there is no CVA tenderness EXT: Tender to palpation over the biceps tendon insertion, right deltoid and right anterior shoulder without bony deformity or loss of fullness, pain with testing of supraspinatus strength but normal strength in her bilateral upper extremities in all muscle groups, patient is unable to raise her arm above her head completely secondary to pain but I am able to passively range her shoulder completely. She also has pain with reaching behind her back with the right arm from both superior and  inferior directions.  2+ radial pulses bilaterally, sensation to light touch intact diffusely, otherwise Normal ROM in all joints; otherwise extremity is are non-tender to palpation; no edema; normal capillary refill; no cyanosis, no calf tenderness or swelling    SKIN: Normal color for age and race; warm NEURO: Moves all extremities equally, sensation to light touch intact diffusely,  cranial nerves II through XII intact PSYCH: The patient's mood and manner are appropriate. Grooming and personal hygiene are appropriate.  MEDICAL DECISION MAKING: Patient here with right shoulder pain. No history of injury. Suspect tendinitis possibly of the rotator cuff. No sign of septic arthritis on exam. Neurovascular intact distally. Discussed with patient that I feel that x-ray would be of little utility given no signs of dislocation on exam under history of injury to suggest fracture. I recommended medical therapy with anti-inflammatories and pain medication as well as rest, ice and elevation. Will give orthopedic follow-up information if symptoms do not improve despite medical management and she may need further workup including MRI, intra-articular steroids. Discussed customary and usual return precautions. She verbalizes understanding and is comfortable with this plan.     Layla Maw Ward, DO 10/19/14 [phone]

## 2014-10-23 ENCOUNTER — Emergency Department (HOSPITAL_BASED_OUTPATIENT_CLINIC_OR_DEPARTMENT_OTHER): Payer: No Typology Code available for payment source

## 2014-10-23 ENCOUNTER — Emergency Department (HOSPITAL_BASED_OUTPATIENT_CLINIC_OR_DEPARTMENT_OTHER)
Admission: EM | Admit: 2014-10-23 | Discharge: 2014-10-23 | Disposition: A | Discharge status: Home / Self Care | Payer: No Typology Code available for payment source | Attending: Emergency Medicine | Admitting: Emergency Medicine

## 2014-10-23 ENCOUNTER — Encounter (HOSPITAL_BASED_OUTPATIENT_CLINIC_OR_DEPARTMENT_OTHER): Payer: Self-pay

## 2014-10-23 DIAGNOSIS — E119 Type 2 diabetes mellitus without complications: Secondary | ICD-10-CM | POA: Diagnosis not present

## 2014-10-23 DIAGNOSIS — Z79899 Other long term (current) drug therapy: Secondary | ICD-10-CM | POA: Diagnosis not present

## 2014-10-23 DIAGNOSIS — R52 Pain, unspecified: Secondary | ICD-10-CM

## 2014-10-23 DIAGNOSIS — M25511 Pain in right shoulder: Secondary | ICD-10-CM | POA: Diagnosis not present

## 2014-10-23 MED ORDER — METHOCARBAMOL 500 MG PO TABS
1000.0000 mg | ORAL_TABLET | Freq: Once | ORAL | Status: AC
  Administered 2014-10-23: 1000 mg via ORAL
  Filled 2014-10-23: qty 2

## 2014-10-23 MED ORDER — KETOROLAC TROMETHAMINE 60 MG/2ML IM SOLN
30.0000 mg | Freq: Once | INTRAMUSCULAR | Status: AC
  Administered 2014-10-23: 30 mg via INTRAMUSCULAR
  Filled 2014-10-23: qty 2

## 2014-10-23 MED ORDER — HYDROCODONE-ACETAMINOPHEN 5-325 MG PO TABS: ORAL_TABLET | ORAL | Status: AC

## 2014-10-23 MED ORDER — HYDROCODONE-ACETAMINOPHEN 5-325 MG PO TABS
1.0000 | ORAL_TABLET | Freq: Once | ORAL | Status: AC
  Administered 2014-10-23: 1 via ORAL
  Filled 2014-10-23: qty 1

## 2014-10-23 MED ORDER — METHOCARBAMOL 500 MG PO TABS: 1000.0000 mg | ORAL_TABLET | Freq: Four times a day (QID) | ORAL | Status: AC | PRN

## 2014-10-23 NOTE — ED Notes (Signed)
Continued right shoulder and arm pain since visit on Friday.

## 2014-10-23 NOTE — Discharge Instructions (Signed)
Only use the arm sling for up to 2 days. Take the arm out and rotate the shoulder every 4 hours.   Please take ibuprofen  (this is normally 2 over the counter pills) every 6 hours (take with food to minimze stomach irritation).   Take robaxin and/or Vicodin for breakthrough pain, do not drink alcohol, drive, care for children or perfom other critical tasks while taking robaxin and/or Vicodin .  Please follow with your primary care doctor in the next 2 days for a check-up. They must obtain records for further management.   Do not hesitate to return to the Emergency Department for any new, worsening or concerning symptoms.

## 2014-10-23 NOTE — ED Notes (Signed)
Patient transported to X-ray via stretcher per tech. 

## 2014-10-23 NOTE — ED Provider Notes (Signed)
CSN: [id number]     Arrival date & time 10/23/14  1719 History   First MD Initiated Contact with Patient 10/23/14 1800     Chief Complaint  Patient presents with  . Shoulder Pain     (Consider location/radiation/quality/duration/timing/severity/associated sxs/prior Treatment) HPI  Blood pressure 131/91, pulse 106, temperature 99.2 F (37.3 C), temperature source Oral, resp. rate 18, height 5\' 9"  (1.753 m), weight 259 lb (117.482 kg), last menstrual period 10/05/2014, SpO2 98 %.  Kei Langhorst is a 39 y.o. female complaining of worsening shoulder and right tricep pain. Patient is right-hand-dominant, there was no reciprocating trauma. 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 She denies any wrist pain. She's been taking Vicodin and NSAIDs home with little relief. She's not had a chance to follow-up at orthopedist (patient was seen for similar last week). States the pain is exacerbated with movement and lying flat. She denies any associated cervicalgia. She rates her pain at 8 out of 10.  Past Medical History  Diagnosis Date  . Diabetes mellitus without complication    History reviewed. No pertinent past surgical history. No family history on file. History  Substance Use Topics  . Smoking status: Never Smoker   . Smokeless tobacco: Not on file  . Alcohol Use: Yes   OB History    No data available     Review of Systems  10 systems reviewed and found to be negative, except as noted in the HPI.   Allergies  Review of patient's allergies indicates no known allergies.  Home Medications   Prior to Admission medications   Medication Sig Start Date End Date Taking? Authorizing Provider  Albiglutide (TANZEUM) 30 MG PEN Inject into the skin once a week.    Historical Provider, MD  glipiZIDE (GLUCOTROL) 5 MG tablet Take 1 tablet (5 mg total) by mouth daily. 04/12/13   Cherrie Distance, PA-C  HYDROcodone-acetaminophen (NORCO/VICODIN) 5-325 MG per tablet Take 1-2 tablets  by mouth every 6 hours as needed for pain and/or cough. 10/23/14   Isiaha Greenup, PA-C  ibuprofen (ADVIL,MOTRIN) 800 MG tablet Take 1 tablet (800 mg total) by mouth every 8 (eight) hours as needed for mild pain. 10/19/14   Kristen N Ward, DO  lisinopril (PRINIVIL,ZESTRIL) 10 MG tablet Take 10 mg by mouth daily.    Historical Provider, MD  metFORMIN (GLUCOPHAGE) 850 MG tablet Take 1 tablet (850 mg total) by mouth 2 (two) times daily with a meal. 04/12/13   Cherrie Distance, PA-C  methocarbamol (ROBAXIN) 500 MG tablet Take 2 tablets (1,000 mg total) by mouth 4 (four) times daily as needed (Pain). 10/23/14   Gurshaan Matsuoka, PA-C  simvastatin (ZOCOR) 10 MG tablet Take 10 mg by mouth daily.    Historical Provider, MD   BP 128/83 mmHg  Pulse 90  Temp(Src) 99.2 F (37.3 C) (Oral)  Resp 18  Ht 5\' 9"  (1.753 m)  Wt 259 lb (117.482 kg)  BMI 38.23 kg/m2  SpO2 98%  LMP 10/05/2014 Physical Exam  Constitutional: She is oriented to person, place, and time. She appears well-developed and well-nourished. No distress.  HENT:  Head: Normocephalic.  Eyes: Conjunctivae and EOM are normal.  Cardiovascular: Normal rate.   Pulmonary/Chest: Effort normal. No stridor.  Musculoskeletal: Normal range of motion. She exhibits tenderness.  Patient has good range of motion and can abduct to greater than 90. No focal tenderness to palpation along the rotator cuff, drop arm is negative. She is distally neurovascularly intact. No warmth or  overlying skin changes.  Neurological: She is alert and oriented to person, place, and time.  Psychiatric: She has a normal mood and affect.  Nursing note and vitals reviewed.   ED Course  Procedures (including critical care time) Labs Review Labs Reviewed - No data to display  Imaging Review Dg Shoulder Right  10/23/2014   CLINICAL DATA:  Right shoulder pain for 1 week  EXAM: RIGHT SHOULDER - 2+ VIEW  COMPARISON:  None.  FINDINGS: There is no evidence of fracture or dislocation.  There is no evidence of arthropathy or other focal bone abnormality. Soft tissues are unremarkable.  IMPRESSION: Negative.   Electronically Signed   By: Natasha Mead M.D.   On: 10/23/2014 18:54     EKG Interpretation None      MDM   Final diagnoses:  Pain  Shoulder arthralgia, right    Filed Vitals:   10/23/14 1727 10/23/14 1916  BP: 131/91 128/83  Pulse: 106 90  Temp: 99.2 F (37.3 C)   TempSrc: Oral   Resp: 18 18  Height:  (1.753 m)   Weight: 259 lb (117.482 kg)   SpO2: 98% 98%    Medications  ketorolac (TORADOL) injection 30 mg (30 mg Intramuscular Given 10/23/14 1837)  HYDROcodone-acetaminophen (NORCO/VICODIN) 5-325 MG per tablet 1 tablet (1 tablet Oral Given 10/23/14 1837)  methocarbamol (ROBAXIN) tablet 1,000 mg (1,000 mg Oral Given 10/23/14 1837)    Cambree Hendrix is a pleasant 39 y.o. female presenting with persistent, atraumatic right shoulder pain. Excellent range of motion. Distally neurovascularly intact, no focal tenderness. X-ray negative, patient given sling and advise close orthopedic follow-up.  Evaluation does not show pathology that would require ongoing emergent intervention or inpatient treatment. Pt is hemodynamically stable and mentating appropriately. Discussed findings and plan with patient/guardian, who agrees with care plan. All questions answered. Return precautions discussed and outpatient follow up given.   Discharge Medication List as of 10/23/2014  7:21 PM    START taking these medications   Details  methocarbamol (ROBAXIN) 500 MG tablet Take 2 tablets (1,000 mg total) by mouth 4 (four) times daily as needed (Pain)., Starting 10/23/2014, Until Discontinued, Print             Wynetta Emery, PA-C 10/23/14 2040  Richardean Canal, MD 10/23/14 [phone]

## 2016-11-29 ENCOUNTER — Emergency Department (HOSPITAL_BASED_OUTPATIENT_CLINIC_OR_DEPARTMENT_OTHER): Payer: BLUE CROSS/BLUE SHIELD

## 2016-11-29 ENCOUNTER — Encounter (HOSPITAL_BASED_OUTPATIENT_CLINIC_OR_DEPARTMENT_OTHER): Payer: Self-pay | Admitting: Emergency Medicine

## 2016-11-29 ENCOUNTER — Emergency Department (HOSPITAL_BASED_OUTPATIENT_CLINIC_OR_DEPARTMENT_OTHER)
Admission: EM | Admit: 2016-11-29 | Discharge: 2016-11-29 | Disposition: A | Discharge status: Home / Self Care | Payer: BLUE CROSS/BLUE SHIELD | Attending: Physician Assistant | Admitting: Physician Assistant

## 2016-11-29 DIAGNOSIS — D649 Anemia, unspecified: Secondary | ICD-10-CM | POA: Insufficient documentation

## 2016-11-29 DIAGNOSIS — L089 Local infection of the skin and subcutaneous tissue, unspecified: Secondary | ICD-10-CM | POA: Insufficient documentation

## 2016-11-29 DIAGNOSIS — E119 Type 2 diabetes mellitus without complications: Secondary | ICD-10-CM | POA: Insufficient documentation

## 2016-11-29 DIAGNOSIS — N390 Urinary tract infection, site not specified: Secondary | ICD-10-CM | POA: Insufficient documentation

## 2016-11-29 DIAGNOSIS — R5383 Other fatigue: Secondary | ICD-10-CM | POA: Diagnosis present

## 2016-11-29 DIAGNOSIS — Z7984 Long term (current) use of oral hypoglycemic drugs: Secondary | ICD-10-CM | POA: Insufficient documentation

## 2016-11-29 DIAGNOSIS — I10 Essential (primary) hypertension: Secondary | ICD-10-CM | POA: Insufficient documentation

## 2016-11-29 HISTORY — DX: Pure hypercholesterolemia, unspecified: E78.00

## 2016-11-29 HISTORY — DX: Essential (primary) hypertension: I10

## 2016-11-29 LAB — URINALYSIS, ROUTINE W REFLEX MICROSCOPIC
Bilirubin Urine: NEGATIVE
GLUCOSE, UA: NEGATIVE mg/dL
Hgb urine dipstick: NEGATIVE
Ketones, ur: NEGATIVE mg/dL
NITRITE: NEGATIVE
PH: 6 (ref 5.0–8.0)
Protein, ur: NEGATIVE mg/dL
Specific Gravity, Urine: 1.02 (ref 1.005–1.030)

## 2016-11-29 LAB — CBC WITH DIFFERENTIAL/PLATELET
Basophils Absolute: 0 10*3/uL (ref 0.0–0.1)
Basophils Relative: 0 %
EOS ABS: 0.1 10*3/uL (ref 0.0–0.7)
Eosinophils Relative: 2 %
HEMATOCRIT: 28.9 % — AB (ref 36.0–46.0)
Hemoglobin: 8.8 g/dL — ABNORMAL LOW (ref 12.0–15.0)
Lymphocytes Relative: 23 %
Lymphs Abs: 1.5 10*3/uL (ref 0.7–4.0)
MCH: 24.8 pg — ABNORMAL LOW (ref 26.0–34.0)
MCHC: 30.4 g/dL (ref 30.0–36.0)
MCV: 81.4 fL (ref 78.0–100.0)
MONO ABS: 0.6 10*3/uL (ref 0.1–1.0)
Monocytes Relative: 9 %
NEUTROS ABS: 4.2 10*3/uL (ref 1.7–7.7)
NEUTROS PCT: 66 %
Platelets: 256 10*3/uL (ref 150–400)
RBC: 3.55 MIL/uL — ABNORMAL LOW (ref 3.87–5.11)
RDW: 14.8 % (ref 11.5–15.5)
WBC: 6.4 10*3/uL (ref 4.0–10.5)

## 2016-11-29 LAB — BASIC METABOLIC PANEL
ANION GAP: 7 (ref 5–15)
BUN: 11 mg/dL (ref 6–20)
CALCIUM: 9 mg/dL (ref 8.9–10.3)
CO2: 24 mmol/L (ref 22–32)
Chloride: 105 mmol/L (ref 101–111)
Creatinine, Ser: 0.74 mg/dL (ref 0.44–1.00)
GFR calc non Af Amer: >60 mL/min (ref >60)
Glucose, Bld: 131 mg/dL — ABNORMAL HIGH (ref 65–99)
Potassium: 4.1 mmol/L (ref 3.5–5.1)
SODIUM: 136 mmol/L (ref 135–145)

## 2016-11-29 LAB — URINALYSIS, MICROSCOPIC (REFLEX): RBC / HPF: NONE SEEN RBC/hpf (ref 0–5)

## 2016-11-29 LAB — PREGNANCY, URINE: Preg Test, Ur: NEGATIVE

## 2016-11-29 LAB — CBG MONITORING, ED: GLUCOSE-CAPILLARY: 125 mg/dL — AB (ref 65–99)

## 2016-11-29 LAB — TROPONIN I

## 2016-11-29 MED ORDER — CEPHALEXIN 500 MG PO CAPS: 500.0000 mg | ORAL_CAPSULE | Freq: Three times a day (TID) | ORAL | 0 refills | Status: AC

## 2016-11-29 NOTE — ED Provider Notes (Signed)
[facility] Provider Note   CSN: [id number] Arrival date & time: 11/29/16  1141     History   Chief Complaint Chief Complaint  Patient presents with  . Fatigue    HPI Carla Elliott is a 41 y.o. female who presents to emergency department with chief complaint of fatigue. Patient states that over the past week she has felt strangely exhausted. She did note that 3 weeks ago her PCP changed her from nortriptyline to trazodone for sleep. She has a history of chronic insomnia. Patient states that she was also bitten by something on her lower left leg and has noticed increasing pain. She denies fevers, chills, myalgias. She notes that the area is tender to palpation. Patient's lab work also shows a low hemoglobin. The last hemoglobin at seen on record was 11 back in 2016. I reviewed EMR and was unable to find and repeat blood count. Patient denies melena or hematochezia. She is not taking any anticoagulants. She does endorse very heavy periods. she is not taking any iron. Patient states that last week she had 2 episodes of dizziness and shortness of breath at work. These occurred while she was up and walking. She denies symptoms of presyncope. She denies orthopnea, exertional dyspnea or PND. She's not had any leg swelling or chest pain. HPI  Past Medical History:  Diagnosis Date  . Diabetes mellitus without complication (HCC)   . Hypercholesteremia   . Hypertension     There are no active problems to display for this patient.   History reviewed. No pertinent surgical history.  OB History    No data available       Home Medications    Prior to Admission medications   Medication Sig Start Date End Date Taking? Authorizing Provider  Albiglutide (TANZEUM) 30 MG PEN Inject into the skin once a week.    [provider]  glipiZIDE (GLUCOTROL) 5 MG tablet Take 1 tablet (5 mg total) by mouth daily. 04/12/13   Cherrie Distance, PA-C  HYDROcodone-acetaminophen  (NORCO/VICODIN) 5-325 MG per tablet Take 1-2 tablets by mouth every 6 hours as needed for pain and/or cough. 10/23/14   Pisciotta, Joni Reining, PA-C  ibuprofen (ADVIL,MOTRIN) 800 MG tablet Take 1 tablet (800 mg total) by mouth every 8 (eight) hours as needed for mild pain. 10/19/14   Ward, Layla Maw, DO  lisinopril (PRINIVIL,ZESTRIL) 10 MG tablet Take 10 mg by mouth daily.    [provider]  metFORMIN (GLUCOPHAGE) 850 MG tablet Take 1 tablet (850 mg total) by mouth 2 (two) times daily with a meal. 04/12/13   Cherrie Distance, PA-C  methocarbamol (ROBAXIN) 500 MG tablet Take 2 tablets (1,000 mg total) by mouth 4 (four) times daily as needed (Pain). 10/23/14   Pisciotta, Joni Reining, PA-C  simvastatin (ZOCOR) 10 MG tablet Take 10 mg by mouth daily.    [provider]    Family History History reviewed. No pertinent family history.  Social History Social History  Substance Use Topics  . Smoking status: Never Smoker  . Smokeless tobacco: Never Used  . Alcohol use Yes     Allergies   Patient has no known allergies.   Review of Systems Review of Systems  Ten systems reviewed and are negative for acute change, except as noted in the HPI.   Physical Exam Updated Vital Signs BP (!) 135/92 (BP Location: Left Arm)   Pulse 88   Temp 98.3 F (36.8 C) (Oral)   Resp 18   Wt 120.5  kg (265 lb 10.5 oz)   LMP 11/08/2016   SpO2 100%   BMI 39.23 kg/m   Physical Exam  Constitutional: She is oriented to person, place, and time. She appears well-developed and well-nourished. No distress.  HENT:  Head: Normocephalic and atraumatic.  Eyes: Conjunctivae are normal. No scleral icterus.  Neck: Normal range of motion.  Cardiovascular: Normal rate, regular rhythm and normal heart sounds.  Exam reveals no gallop and no friction rub.   No murmur heard. Pulmonary/Chest: Effort normal and breath sounds normal. No respiratory distress.  Abdominal: Soft. Bowel sounds are normal. She exhibits no  distension and no mass. There is no tenderness. There is no guarding.  Neurological: She is alert and oriented to person, place, and time.  Skin: Skin is warm and dry. She is not diaphoretic.  3 cm circular area of violaceous tissue with out in duration, some swelling and exquisitely tender to palpation.  Psychiatric: Her behavior is normal.  Nursing note and vitals reviewed.    ED Treatments / Results  Labs (all labs ordered are listed, but only abnormal results are displayed) Labs Reviewed  CBC WITH DIFFERENTIAL/PLATELET - Abnormal; Notable for the following:       Result Value   RBC 3.55 (*)    Hemoglobin 8.8 (*)    HCT 28.9 (*)    MCH 24.8 (*)    All other components within normal limits  BASIC METABOLIC PANEL - Abnormal; Notable for the following:    Glucose, Bld 131 (*)    All other components within normal limits  CBG MONITORING, ED - Abnormal; Notable for the following:    Glucose-Capillary 125 (*)    All other components within normal limits  URINALYSIS, ROUTINE W REFLEX MICROSCOPIC  PREGNANCY, URINE  TROPONIN I    EKG  EKG Interpretation None       Radiology No results found.  Procedures Procedures (including critical care time)  Medications Ordered in ED Medications - No data to display   Initial Impression / Assessment and Plan / ED Course  I have reviewed the triage vital signs and the nursing notes.  Pertinent labs & imaging results that were available during my care of the patient were reviewed by me and considered in my medical decision making (see chart for details).     Patient with what appears to be a urinary tract infection. Skin infection of the left lower extremity. Hemoglobin is low however does not appear to be at a point where she might need transfusion is likely chronic although I'm unable to compare with any recent hemoglobin tests. Patient advised to take iron and to follow-up with her PCP. She'll be given Keflex at discharge to  cover for UTI and skin infection. EKG, chest x-ray and labs reviewed without significant abnormality. She appears safe for discharge at this time  Final Clinical Impressions(s) / ED Diagnoses   Final diagnoses:  None    New Prescriptions New Prescriptions   No medications on file     Arthor Captain, PA-C 11/29/16 1556    Mackuen, Cindee Salt, MD 12/06/16 1003

## 2016-11-29 NOTE — Discharge Instructions (Signed)
By some over-the-counter iron to begin taking daily. Note this can cause constipation you may need to take a stool softener with it. Please follow-up with your primary care physician regarding her anemia. He also appeared to have a urinary tract infection. I'm giving you an antibiotic that will cover for both the skin infection and urinary tract infection today. Please take all of her antibiotics until finished. Your labs and imaging along with your EKG otherwise appear normal. Return to the emergency department if he developed fevers, chills, unrelenting vomiting.

## 2016-11-29 NOTE — ED Triage Notes (Addendum)
Patient states that she has not felt good since last tues. The patient reports that she has diabetes and has had nausea and has been really tired. The patient reports that she also had a bug bite to her left calf

## 2016-11-29 NOTE — ED Notes (Signed)
Patient verbalized understanding of discharge instructions. Patient denies any further requests.

## 2017-03-07 IMAGING — DX DG SHOULDER 2+V*R*
4 series · 4 of 4 positions shown · non-contrast
Comparison: None.

CLINICAL DATA: Right shoulder pain for 1 week

EXAM:
RIGHT SHOULDER - 2+ VIEW

[shoulder grashey]
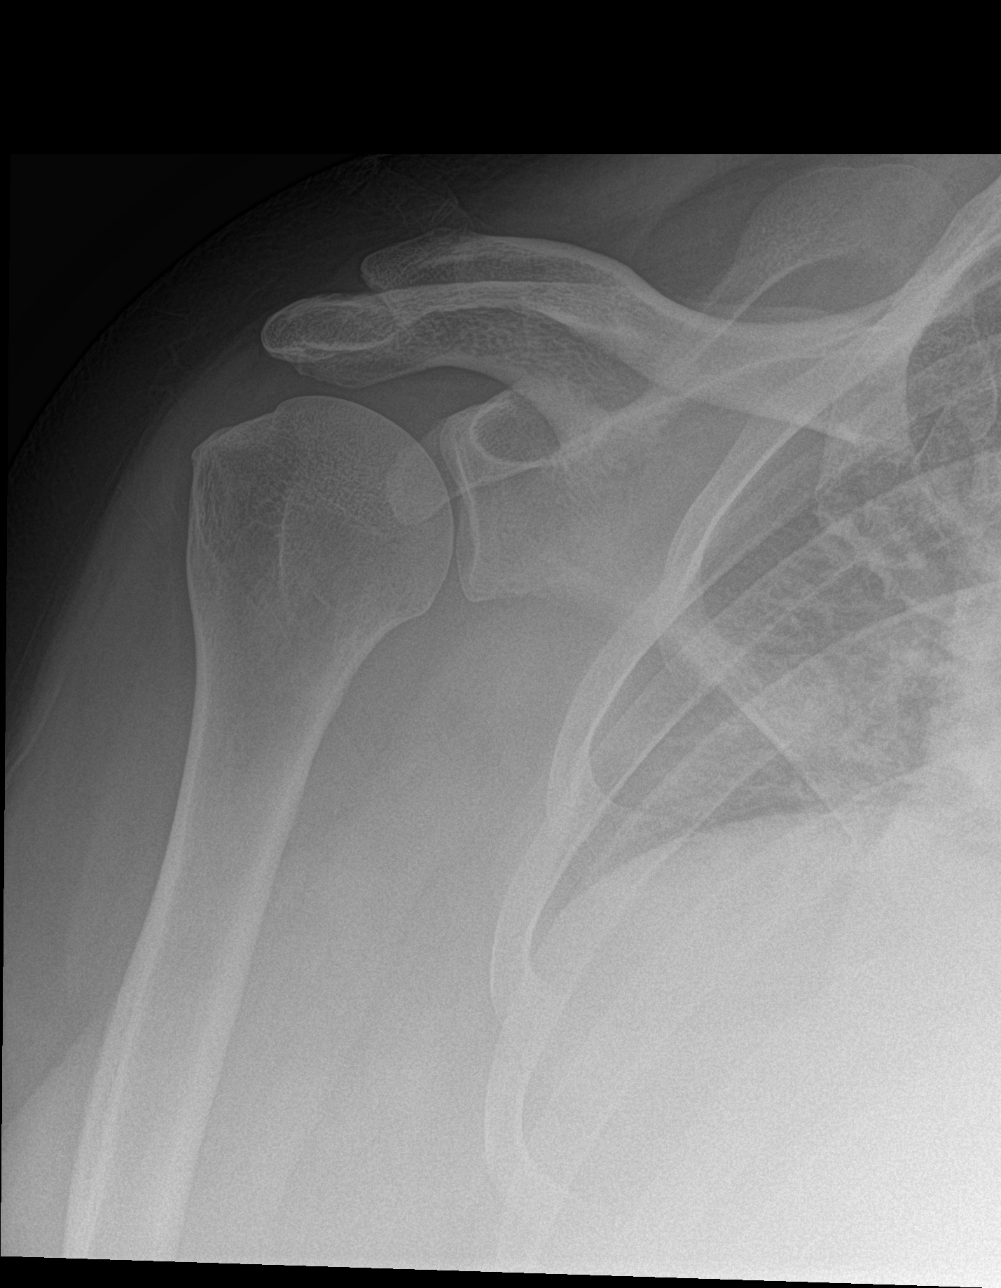

[shoulder axillary]
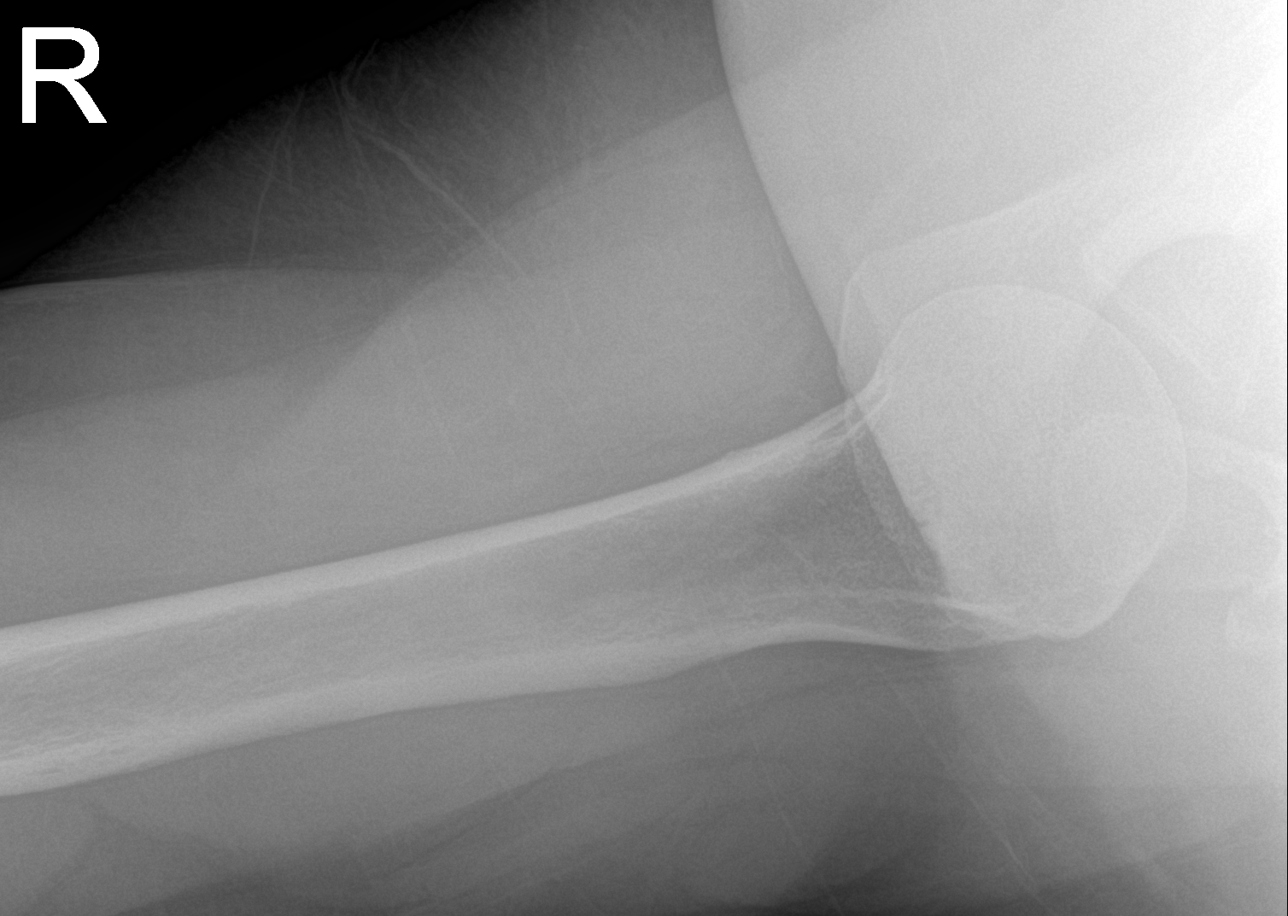

[shoulder y view (1 of 2)]
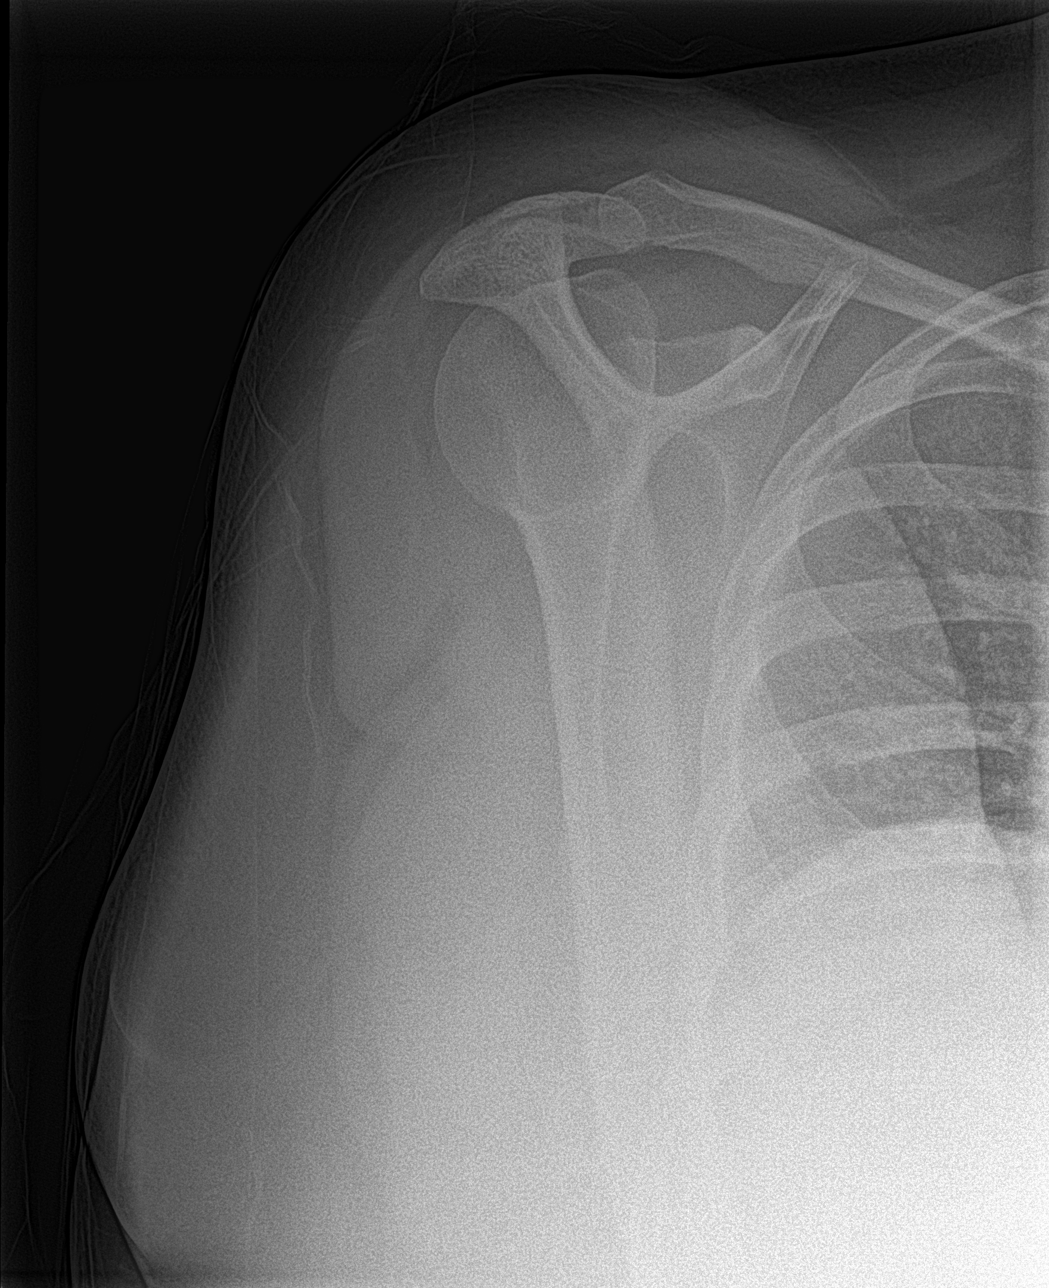

[shoulder y view (2 of 2)]
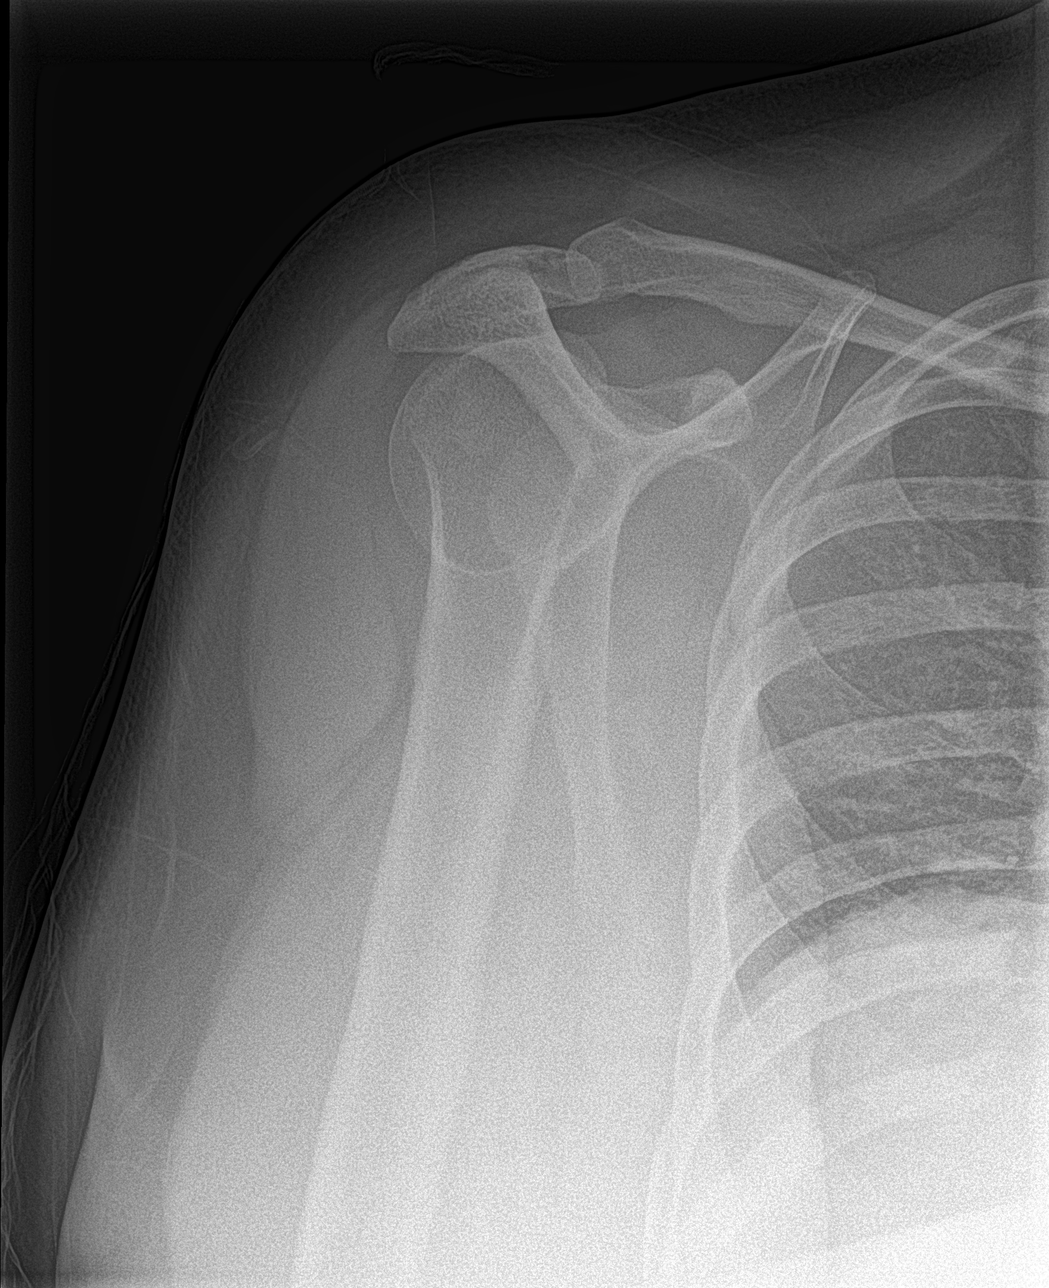

[4 of 4 positions shown; findings below may reference images not displayed]

FINDINGS: There is no evidence of fracture or dislocation. There is no
evidence of arthropathy or other focal bone abnormality. Soft
tissues are unremarkable.
IMPRESSION: Negative.

## 2019-10-06 ENCOUNTER — Emergency Department (HOSPITAL_COMMUNITY)
Admission: EM | Admit: 2019-10-06 | Discharge: 2019-10-06 | Disposition: A | Discharge status: Home / Self Care | Payer: BLUE CROSS/BLUE SHIELD | Attending: Emergency Medicine | Admitting: Emergency Medicine

## 2019-10-06 DIAGNOSIS — Z5321 Procedure and treatment not carried out due to patient leaving prior to being seen by health care provider: Secondary | ICD-10-CM | POA: Diagnosis not present

## 2019-10-06 DIAGNOSIS — R519 Headache, unspecified: Secondary | ICD-10-CM | POA: Diagnosis present

## 2019-10-06 DIAGNOSIS — H539 Unspecified visual disturbance: Secondary | ICD-10-CM | POA: Diagnosis not present

## 2019-10-06 DIAGNOSIS — R4781 Slurred speech: Secondary | ICD-10-CM | POA: Insufficient documentation

## 2019-10-06 LAB — BASIC METABOLIC PANEL
Anion gap: 9 (ref 5–15)
BUN: 13 mg/dL (ref 6–20)
CO2: 26 mmol/L (ref 22–32)
Calcium: 9.3 mg/dL (ref 8.9–10.3)
Chloride: 100 mmol/L (ref 98–111)
Creatinine, Ser: 0.85 mg/dL (ref 0.44–1.00)
GFR calc Af Amer: >60 mL/min (ref >60)
GFR calc non Af Amer: >60 mL/min (ref >60)
Glucose, Bld: 128 mg/dL — ABNORMAL HIGH (ref 70–99)
Potassium: 3.6 mmol/L (ref 3.5–5.1)
Sodium: 135 mmol/L (ref 135–145)

## 2019-10-06 LAB — CBC
HCT: 37.2 % (ref 36.0–46.0)
Hemoglobin: 11.5 g/dL — ABNORMAL LOW (ref 12.0–15.0)
MCH: 28.5 pg (ref 26.0–34.0)
MCHC: 30.9 g/dL (ref 30.0–36.0)
MCV: 92.1 fL (ref 80.0–100.0)
Platelets: 264 10*3/uL (ref 150–400)
RBC: 4.04 MIL/uL (ref 3.87–5.11)
RDW: 12.3 % (ref 11.5–15.5)
WBC: 11.2 10*3/uL — ABNORMAL HIGH (ref 4.0–10.5)
nRBC: 0 % (ref 0.0–0.2)

## 2019-10-06 LAB — I-STAT BETA HCG BLOOD, ED (MC, WL, AP ONLY): I-stat hCG, quantitative: <5 m[IU]/mL (ref <5)

## 2019-10-06 MED ORDER — SODIUM CHLORIDE 0.9% FLUSH: 3.0000 mL | Freq: Once | INTRAVENOUS | Status: DC

## 2019-10-06 NOTE — ED Triage Notes (Signed)
Pt bib ems from home with reports of severe headache, slurred speech and visual disturbances onset today at approx 1500. Speech and visual changes resolved prior to ems arrival on scene. Pt only complaint now is her headache. No weakness, facial droop noted. Pt took advil pta with no improvement. 120/81 HR 82 RR 18 CBG 99 99% RA

## 2022-04-08 ENCOUNTER — Other Ambulatory Visit: Payer: Self-pay

## 2022-04-08 ENCOUNTER — Emergency Department (HOSPITAL_COMMUNITY): Payer: BLUE CROSS/BLUE SHIELD

## 2022-04-08 ENCOUNTER — Emergency Department (HOSPITAL_COMMUNITY)
Admission: EM | Admit: 2022-04-08 | Discharge: 2022-04-09 | Disposition: A | Discharge status: Home / Self Care | Payer: BLUE CROSS/BLUE SHIELD | Attending: Emergency Medicine | Admitting: Emergency Medicine

## 2022-04-08 ENCOUNTER — Encounter (HOSPITAL_COMMUNITY): Payer: Self-pay | Admitting: Emergency Medicine

## 2022-04-08 DIAGNOSIS — M546 Pain in thoracic spine: Secondary | ICD-10-CM | POA: Diagnosis not present

## 2022-04-08 DIAGNOSIS — W19XXXA Unspecified fall, initial encounter: Secondary | ICD-10-CM

## 2022-04-08 DIAGNOSIS — S0003XA Contusion of scalp, initial encounter: Secondary | ICD-10-CM | POA: Diagnosis not present

## 2022-04-08 DIAGNOSIS — Z79899 Other long term (current) drug therapy: Secondary | ICD-10-CM | POA: Diagnosis not present

## 2022-04-08 DIAGNOSIS — R930 Abnormal findings on diagnostic imaging of skull and head, not elsewhere classified: Secondary | ICD-10-CM | POA: Insufficient documentation

## 2022-04-08 DIAGNOSIS — Z7984 Long term (current) use of oral hypoglycemic drugs: Secondary | ICD-10-CM | POA: Diagnosis not present

## 2022-04-08 DIAGNOSIS — S0990XA Unspecified injury of head, initial encounter: Secondary | ICD-10-CM | POA: Diagnosis present

## 2022-04-08 DIAGNOSIS — I10 Essential (primary) hypertension: Secondary | ICD-10-CM | POA: Diagnosis not present

## 2022-04-08 DIAGNOSIS — E119 Type 2 diabetes mellitus without complications: Secondary | ICD-10-CM | POA: Insufficient documentation

## 2022-04-08 DIAGNOSIS — W108XXA Fall (on) (from) other stairs and steps, initial encounter: Secondary | ICD-10-CM | POA: Diagnosis not present

## 2022-04-08 NOTE — ED Triage Notes (Addendum)
Pt c/o falling down some stairs with c/o head and back pain.

## 2022-04-08 NOTE — ED Provider Triage Note (Signed)
Emergency Medicine Provider Triage Evaluation Note  Carla Elliott , a 47 y.o. female  was evaluated in triage.  Pt complains of \\fall .  Tripped down stairs, hit head against concrete and lost consciousness for a few seconds.  No vision changes, neck pain, headache amnesia, nausea or vomiting.  Also having upper back pain but no lower back pain, urinating, fecal incontinence or hematuria.  Review of Systems  Per HPI  Physical Exam  BP (!) 124/107 (BP Location: Right Arm)   Pulse (!) 102   Temp 98 F (36.7 C) (Oral)   Resp 18   Ht 5\' 9"  (1.753 m)   Wt 106.6 kg   SpO2 97%   BMI 34.70 kg/m  Gen:   Awake, no distress   Resp:  Normal effort  MSK:   Moves extremities without difficulty  Other:  No midline tenderness to the cervical spine, no nuchal rigidity.  Neuroexam nonfocal, midline tenderness thoracic spine T10  Medical Decision Making  Medically screening exam initiated at 11:37 PM.  Appropriate orders placed.  Avnoor Koury was informed that the remainder of the evaluation will be completed by another provider, this initial triage assessment does not replace that evaluation, and the importance of remaining in the ED until their evaluation is complete.     Sherrill Raring, [place] 04/08/22 2338

## 2022-04-09 ENCOUNTER — Emergency Department (HOSPITAL_COMMUNITY): Payer: BLUE CROSS/BLUE SHIELD

## 2022-04-09 NOTE — ED Provider Notes (Signed)
[facility] Provider Note   CSN: [id number] Arrival date & time: 04/08/22  2307     History  Chief Complaint  Patient presents with   Fall    Ramandeep Arington is a 47 y.o. female.   Fall     This is a 47 year old female with history of hypertension, hyperlipidemia, diabetes presenting to the emergency department due to fall.  Patient states she had a mechanical fall where she tripped on the stairs, she hit her head against concrete floor.  She did lose consciousness for a few seconds, denies any antegrade amnesia, nausea, vomiting, neck pain, vision changes.  She also is having some pain to the upper back but none to the lower back, also no change in bladder or bowel functions, bilateral lower extremity weakness or numbness, dysuria, hematuria.  No medicine prior to arrival.  Home Medications Prior to Admission medications   Medication Sig Start Date End Date Taking? Authorizing Provider  Albiglutide (TANZEUM) 30 MG PEN Inject into the skin once a week.    [provider]  cephALEXin (KEFLEX) 500 MG capsule Take 1 capsule (500 mg total) by mouth 3 (three) times daily. 11/29/16   Harris, Abigail, PA-C  glipiZIDE (GLUCOTROL) 5 MG tablet Take 1 tablet (5 mg total) by mouth daily. 04/12/13   Ignacia Felling, PA-C  HYDROcodone-acetaminophen (NORCO/VICODIN) 5-325 MG per tablet Take 1-2 tablets by mouth every 6 hours as needed for pain and/or cough. 10/23/14   Pisciotta, Elmyra Ricks, PA-C  ibuprofen (ADVIL,MOTRIN) 800 MG tablet Take 1 tablet (800 mg total) by mouth every 8 (eight) hours as needed for mild pain. 10/19/14   Ward, Delice Bison, DO  lisinopril (PRINIVIL,ZESTRIL) 10 MG tablet Take 10 mg by mouth daily.    [provider]  metFORMIN (GLUCOPHAGE) 850 MG tablet Take 1 tablet (850 mg total) by mouth 2 (two) times daily with a meal. 04/12/13   Ignacia Felling, PA-C  methocarbamol (ROBAXIN) 500 MG tablet Take 2 tablets (1,000 mg total)  by mouth 4 (four) times daily as needed (Pain). 10/23/14   Pisciotta, Elmyra Ricks, PA-C  simvastatin (ZOCOR) 10 MG tablet Take 10 mg by mouth daily.    [provider]      Allergies    Patient has no known allergies.    Review of Systems   Review of Systems  Physical Exam Updated Vital Signs BP (!) 124/107 (BP Location: Right Arm)   Pulse (!) 102   Temp 98 F (36.7 C) (Oral)   Resp 18   Ht 5\' 9"  (1.753 m)   Wt 106.6 kg   SpO2 97%   BMI 34.70 kg/m  Physical Exam Vitals and nursing note reviewed. Exam conducted with a chaperone present.  Constitutional:      Appearance: Normal appearance.  HENT:     Head: Normocephalic and atraumatic.     Comments: Hematoma to the occiput, no periorbital ecchymosis, Battle sign, septal hematoma, malocclusion. Eyes:     General: No scleral icterus.       Right eye: No discharge.        Left eye: No discharge.     Extraocular Movements: Extraocular movements intact.     Pupils: Pupils are equal, round, and reactive to light.  Cardiovascular:     Rate and Rhythm: Normal rate and regular rhythm.     Pulses: Normal pulses.     Heart sounds: Normal heart sounds.     No friction rub. No gallop.  Pulmonary:     Effort: Pulmonary effort is normal. No respiratory distress.     Breath sounds: Normal breath sounds.  Abdominal:     General: Abdomen is flat. Bowel sounds are normal. There is no distension.     Palpations: Abdomen is soft.     Tenderness: There is no abdominal tenderness.  Musculoskeletal:     Cervical back: Normal range of motion. No tenderness.     Comments: Midline tenderness over T10 but no palpable step-offs or crepitus.  Moving all other extremities any difficulty and ambulatory with steady gait  Skin:    General: Skin is warm and dry.     Coloration: Skin is not jaundiced.  Neurological:     Mental Status: She is alert. Mental status is at baseline.     Coordination: Coordination normal.     Comments: Cranial nerves  II through XII are gross intact.  Upper and lower extremity strength symmetric bilaterally.     ED Results / Procedures / Treatments   Labs (all labs ordered are listed, but only abnormal results are displayed) Labs Reviewed - No data to display  EKG None  Radiology CT Head Wo Contrast  Result Date: 04/09/2022 CLINICAL DATA:  Status post fall. EXAM: CT HEAD WITHOUT CONTRAST TECHNIQUE: Contiguous axial images were obtained from the base of the skull through the vertex without intravenous contrast. RADIATION DOSE REDUCTION: This exam was performed according to the departmental dose-optimization program which includes automated exposure control, adjustment of the mA and/or kV according to patient size and/or use of iterative reconstruction technique. COMPARISON:  None Available. FINDINGS: Brain: No evidence of acute infarction, hemorrhage, hydrocephalus, extra-axial collection or mass lesion/mass effect. There are areas of decreased attenuation within the white matter tracts of the supratentorial brain, consistent with microvascular disease changes. This is greater than expected for the patient's age. Vascular: No hyperdense vessel or unexpected calcification. Skull: Normal. Negative for fracture or focal lesion. Sinuses/Orbits: There is mild left maxillary sinus mucosal thickening. Other: Mild to moderate severity occipital scalp soft tissue swelling is seen along the midline. IMPRESSION: 1. No acute intracranial abnormality. 2. Mild to moderate severity occipital scalp soft tissue swelling without evidence of an acute fracture. 3. 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 4. Mild left maxillary sinus disease. Electronically Signed   By: Aram Candela M.D.   On: 04/09/2022 01:04   DG Thoracic Spine 2 View  Result Date: 04/08/2022 CLINICAL DATA:  Fall EXAM: THORACIC SPINE 2 VIEWS COMPARISON:  None Available.  FINDINGS: There is no evidence of thoracic spine fracture. Alignment is normal. There are mild degenerative endplate changes of the mid and lower thoracic spine. IMPRESSION: Negative. Electronically Signed   By: Darliss Cheney M.D.   On: 04/08/2022 23:56    Procedures Procedures    Medications Ordered in ED Medications - No data to display  ED Course/ Medical Decision Making/ A&P                             Medical Decision Making Amount and/or Complexity of Data Reviewed Radiology: ordered.   This is a 47 year old female presenting to the emergency department due to mechanical fall.  Differential includes but not limited to fractures, dislocations, cord injury, concussion.  On exam patient is overall well-appearing and ambulatory steady gait.  There are no appreciable focal deficits.  She has no midline tenderness  of the cervical spine, based on Nexus C-spine criteria I can clear her cervical spine I do not think imaging is indicated.  I considered a basilar skull fracture but no physical manifestations.  Think unlikely.  Given she does have impressive hematomas to the scalp we will proceed with CT head for evaluation.   I ordered and viewed imaging studies.  Plain film of thoracic spine is unremarkable, CT head is with hematoma and shows some chronic vascular disease which appears advanced for patient's age.  We discussed this finding and I gave her a copy of the CT report with instructions to follow-up with PCP for preventative recommendations.  Patient workup today I do not think any additional workup is indicated.  Stable for discharge at this time.        Final Clinical Impression(s) / ED Diagnoses Final diagnoses:  Fall, initial encounter  Abnormal CT of the head    Rx / DC Orders ED Discharge Orders     None         Sherrill Raring, Hershal Coria 04/09/22 0151    Ripley Fraise, MD 04/09/22 [phone]

## 2022-04-09 NOTE — ED Notes (Signed)
The pa discharged the pt

## 2022-04-09 NOTE — Discharge Instructions (Addendum)
You are seen today in the emergency department due to fall.  Your workup today was reassuring as we discussed, there were some abnormal signs of aging on your CT scan today she definitely follow-up with your primary regarding this.  The exact rate is copied below.  I suspect you may develop concussion symptoms, engage in brain rest over the next 2 days.  He can turn to back to work back on Monday feeling better.  Narrative  CLINICAL DATA:  Status post fall.    EXAM:  CT HEAD WITHOUT CONTRAST    TECHNIQUE:  Contiguous axial images were obtained from the base of the skull  through the vertex without intravenous contrast.    RADIATION DOSE REDUCTION: This exam was performed according to the  departmental dose-optimization program which includes automated  exposure control, adjustment of the mA and/or kV according to  patient size and/or use of iterative reconstruction technique.    COMPARISON:  None Available.    FINDINGS:  Brain: No evidence of acute infarction, hemorrhage, hydrocephalus,  extra-axial collection or mass lesion/mass effect.    There are areas of decreased attenuation within the white matter  tracts of the supratentorial brain, consistent with microvascular  disease changes. This is greater than expected for the patient's  age.    Vascular: No hyperdense vessel or unexpected calcification.    Skull: Normal. Negative for fracture or focal lesion.    Sinuses/Orbits: There is mild left maxillary sinus mucosal  thickening.    Other: Mild to moderate severity occipital scalp soft tissue  swelling is seen along the midline.    IMPRESSION:  1. No acute intracranial abnormality.  2. Mild to moderate severity occipital scalp soft tissue swelling  without evidence of an acute fracture.  3. Findings consistent with microvascular disease changes of the  supratentorial brain, greater than expected for the patient's age.  4. Mild left maxillary sinus disease.       Electronically Signed    By: Virgina Norfolk M.D.    On: 04/09/2022 01:04

## 2022-09-16 ENCOUNTER — Emergency Department (HOSPITAL_BASED_OUTPATIENT_CLINIC_OR_DEPARTMENT_OTHER)
Admission: EM | Admit: 2022-09-16 | Discharge: 2022-09-16 | Disposition: A | Discharge status: Home / Self Care | Payer: BLUE CROSS/BLUE SHIELD | Attending: Emergency Medicine | Admitting: Emergency Medicine

## 2022-09-16 ENCOUNTER — Other Ambulatory Visit: Payer: Self-pay

## 2022-09-16 ENCOUNTER — Emergency Department (HOSPITAL_BASED_OUTPATIENT_CLINIC_OR_DEPARTMENT_OTHER): Payer: BLUE CROSS/BLUE SHIELD

## 2022-09-16 DIAGNOSIS — W228XXA Striking against or struck by other objects, initial encounter: Secondary | ICD-10-CM | POA: Insufficient documentation

## 2022-09-16 DIAGNOSIS — S92535A Nondisplaced fracture of distal phalanx of left lesser toe(s), initial encounter for closed fracture: Secondary | ICD-10-CM | POA: Diagnosis not present

## 2022-09-16 DIAGNOSIS — S99922A Unspecified injury of left foot, initial encounter: Secondary | ICD-10-CM | POA: Diagnosis present

## 2022-09-16 NOTE — Discharge Instructions (Signed)
Your fracture will heal on its own over the next 48 weeks.  Try to keep your foot elevated at home and apply ice for the next 2 to 3 days, 10 minutes at a time on and off.  You can take ibuprofen and Tylenol over-the-counter at home as needed for pain.

## 2022-09-16 NOTE — ED Triage Notes (Signed)
Pt thinks she broke her 2nd toe on her left foot, kicked the door making sure it was closed.

## 2022-09-16 NOTE — ED Provider Notes (Signed)
[facility] Provider Note   CSN: [id number] Arrival date & time: 09/16/22  1751     History  Chief Complaint  Patient presents with   Toe Injury    Carla Elliott is a 47 y.o. female presented to ED after stubbing her left toe at home yesterday.  Pain in her left  HPI     Home Medications Prior to Admission medications   Medication Sig Start Date End Date Taking? Authorizing Provider  Albiglutide (TANZEUM) 30 MG PEN Inject into the skin once a week.    [provider]  cephALEXin (KEFLEX) 500 MG capsule Take 1 capsule (500 mg total) by mouth 3 (three) times daily. 11/29/16   Harris, Abigail, PA-C  glipiZIDE (GLUCOTROL) 5 MG tablet Take 1 tablet (5 mg total) by mouth daily. 04/12/13   Cherrie Distance, PA-C  HYDROcodone-acetaminophen (NORCO/VICODIN) 5-325 MG per tablet Take 1-2 tablets by mouth every 6 hours as needed for pain and/or cough. 10/23/14   Pisciotta, Joni Reining, PA-C  ibuprofen (ADVIL,MOTRIN) 800 MG tablet Take 1 tablet (800 mg total) by mouth every 8 (eight) hours as needed for mild pain. 10/19/14   Ward, Layla Maw, DO  lisinopril (PRINIVIL,ZESTRIL) 10 MG tablet Take 10 mg by mouth daily.    [provider]  metFORMIN (GLUCOPHAGE) 850 MG tablet Take 1 tablet (850 mg total) by mouth 2 (two) times daily with a meal. 04/12/13   Cherrie Distance, PA-C  methocarbamol (ROBAXIN) 500 MG tablet Take 2 tablets (1,000 mg total) by mouth 4 (four) times daily as needed (Pain). 10/23/14   Pisciotta, Joni Reining, PA-C  simvastatin (ZOCOR) 10 MG tablet Take 10 mg by mouth daily.    [provider]      Allergies    Patient has no known allergies.    Review of Systems   Review of Systems  Physical Exam Updated Vital Signs BP (!) 149/101   Pulse 86   Temp 98.5 F (36.9 C) (Oral)   Resp 18   SpO2 97%  Physical Exam Constitutional:      General: She is not in acute distress. HENT:     Head: Normocephalic and  atraumatic.  Eyes:     Conjunctiva/sclera: Conjunctivae normal.     Pupils: Pupils are equal, round, and reactive to light.  Cardiovascular:     Rate and Rhythm: Normal rate and regular rhythm.  Pulmonary:     Effort: Pulmonary effort is normal. No respiratory distress.  Musculoskeletal:     Comments: Purple discoloration and tenderness near the left second metatarsal, no open fracture  Skin:    General: Skin is warm and dry.  Neurological:     General: No focal deficit present.     Mental Status: She is alert. Mental status is at baseline.  Psychiatric:        Mood and Affect: Mood normal.        Behavior: Behavior normal.     ED Results / Procedures / Treatments   Labs (all labs ordered are listed, but only abnormal results are displayed) Labs Reviewed - No data to display  EKG None  Radiology DG Toe 2nd Left  Result Date: 09/16/2022 CLINICAL DATA:  Trauma to the left second toe against metal bar EXAM: LEFT SECOND TOE COMPARISON:  None Available. FINDINGS: Nondisplaced fracture of the dorsal base of second toe distal phalanx. There is no evidence of arthropathy or other focal bone abnormality. Soft tissues are unremarkable. IMPRESSION: Nondisplaced fracture  of the dorsal base of the second toe distal phalanx. Electronically Signed   By: Agustin Cree M.D.   On: 09/16/2022 18:50    Procedures Procedures    Medications Ordered in ED Medications - No data to display  ED Course/ Medical Decision Making/ A&P                             Medical Decision Making Amount and/or Complexity of Data Reviewed Radiology: ordered.   Patient is here with an isolated injury to her left second toe.  X-rays ordered and personally viewed interpreted, showing a distal fracture.  This can be managed with postoperative shoe, no further imaging is necessary.  Patient is neurovascularly intact.  We discussed elevation and ice at home.  Okay for discharge        Final Clinical  Impression(s) / ED Diagnoses Final diagnoses:  Closed nondisplaced fracture of distal phalanx of lesser toe of left foot, initial encounter    Rx / DC Orders ED Discharge Orders     None         Dejon Lukas, Kermit Balo, MD 09/16/22 1942

## 2023-02-25 ENCOUNTER — Encounter (HOSPITAL_COMMUNITY): Payer: Self-pay | Admitting: Emergency Medicine

## 2023-02-25 ENCOUNTER — Emergency Department (HOSPITAL_COMMUNITY)
Admission: EM | Admit: 2023-02-25 | Discharge: 2023-02-25 | Disposition: A | Discharge status: Home / Self Care | Payer: BLUE CROSS/BLUE SHIELD | Attending: Emergency Medicine | Admitting: Emergency Medicine

## 2023-02-25 DIAGNOSIS — Z7984 Long term (current) use of oral hypoglycemic drugs: Secondary | ICD-10-CM | POA: Insufficient documentation

## 2023-02-25 DIAGNOSIS — E119 Type 2 diabetes mellitus without complications: Secondary | ICD-10-CM | POA: Insufficient documentation

## 2023-02-25 DIAGNOSIS — Z79899 Other long term (current) drug therapy: Secondary | ICD-10-CM | POA: Diagnosis not present

## 2023-02-25 DIAGNOSIS — T40711A Poisoning by cannabis, accidental (unintentional), initial encounter: Secondary | ICD-10-CM | POA: Insufficient documentation

## 2023-02-25 DIAGNOSIS — T50901A Poisoning by unspecified drugs, medicaments and biological substances, accidental (unintentional), initial encounter: Secondary | ICD-10-CM

## 2023-02-25 DIAGNOSIS — R000 Tachycardia, unspecified: Secondary | ICD-10-CM | POA: Diagnosis not present

## 2023-02-25 DIAGNOSIS — I10 Essential (primary) hypertension: Secondary | ICD-10-CM | POA: Diagnosis not present

## 2023-02-25 NOTE — ED Notes (Signed)
Visitor brought back to try to keep patient calm.

## 2023-02-25 NOTE — ED Provider Notes (Signed)
[place] EMERGENCY DEPARTMENT AT [facility] Provider Note   CSN: [id number] Arrival date & time: 02/25/23  2017     History  Chief Complaint  Patient presents with   Alcohol Intoxication    Carla Elliott is a 47 y.o. female history of diabetes, hypertension presented after having a gummy and 112 ounce beer.  Patient states that she does not usually do drugs.  Partner has Gummies for fibromyalgia and believes that the gummy was too strong for her.  Patient was reportedly having a panic attack with the gummy but is now calm down and states that she feels asymptomatic.  Patient is not combative and is cooperative.  Patient denies any chest pain, shortness of breath, vision changes, headaches, weakness, abdominal pain, nausea/vomiting, shortness of breath.  Home Medications Prior to Admission medications   Medication Sig Start Date End Date Taking? Authorizing Provider  Albiglutide (TANZEUM) 30 MG PEN Inject into the skin once a week.    [provider]  cephALEXin (KEFLEX) 500 MG capsule Take 1 capsule (500 mg total) by mouth 3 (three) times daily. 11/29/16   Harris, Abigail, PA-C  glipiZIDE (GLUCOTROL) 5 MG tablet Take 1 tablet (5 mg total) by mouth daily. 04/12/13   Cherrie Distance, PA-C  HYDROcodone-acetaminophen (NORCO/VICODIN) 5-325 MG per tablet Take 1-2 tablets by mouth every 6 hours as needed for pain and/or cough. 10/23/14   Pisciotta, Joni Reining, PA-C  ibuprofen (ADVIL,MOTRIN) 800 MG tablet Take 1 tablet (800 mg total) by mouth every 8 (eight) hours as needed for mild pain. 10/19/14   Ward, Layla Maw, DO  lisinopril (PRINIVIL,ZESTRIL) 10 MG tablet Take 10 mg by mouth daily.    [provider]  metFORMIN (GLUCOPHAGE) 850 MG tablet Take 1 tablet (850 mg total) by mouth 2 (two) times daily with a meal. 04/12/13   Cherrie Distance, PA-C  methocarbamol (ROBAXIN) 500 MG tablet Take 2 tablets (1,000 mg total) by mouth 4 (four) times daily as needed (Pain). 10/23/14    Pisciotta, Joni Reining, PA-C  simvastatin (ZOCOR) 10 MG tablet Take 10 mg by mouth daily.    [provider]      Allergies    Patient has no known allergies.    Review of Systems   Review of Systems  Physical Exam Updated Vital Signs BP (!) 162/96 (BP Location: Left Arm)   Pulse (!) 110   Temp 98.3 F (36.8 C) (Oral)   Resp 16   SpO2 98%  Physical Exam Vitals reviewed.  Constitutional:      General: She is not in acute distress. HENT:     Head: Normocephalic and atraumatic.  Eyes:     Extraocular Movements: Extraocular movements intact.     Conjunctiva/sclera: Conjunctivae normal.     Pupils: Pupils are equal, round, and reactive to light.  Cardiovascular:     Rate and Rhythm: Regular rhythm. Tachycardia present.     Pulses: Normal pulses.     Heart sounds: Normal heart sounds.     Comments: 2+ bilateral radial/dorsalis pedis pulses with increased rate Pulmonary:     Effort: Pulmonary effort is normal. No respiratory distress.     Breath sounds: Normal breath sounds.  Abdominal:     Palpations: Abdomen is soft.     Tenderness: There is no abdominal tenderness. There is no guarding or rebound.  Musculoskeletal:        General: Normal range of motion.     Cervical back: Normal range of motion and neck supple.  Comments: 5 out of 5 bilateral grip/leg extension strength  Skin:    General: Skin is warm and dry.     Capillary Refill: Capillary refill takes less than 2 seconds.  Neurological:     General: No focal deficit present.     Mental Status: She is alert and oriented to person, place, and time.     Sensory: Sensation is intact.     Motor: Motor function is intact.     Coordination: Coordination is intact.     Gait: Gait is intact.     Comments: Sensation intact in all 4 limbs Cranial nerves III through XII intact Vision grossly intact  Psychiatric:        Mood and Affect: Mood normal.     ED Results / Procedures / Treatments   Labs (all labs  ordered are listed, but only abnormal results are displayed) Labs Reviewed - No data to display  EKG None  Radiology No results found.  Procedures Procedures    Medications Ordered in ED Medications - No data to display  ED Course/ Medical Decision Making/ A&P                                 Medical Decision Making  Carla Elliott 46 y.o. presented today for reaction to gummy, alcohol intake. Working DDx that I considered at this time includes, but not limited to, reaction to cannabinoid, alcohol intoxication, dehydration, toxidrome, SI/HI.  R/o DDx: Toxidrome, SI/HI: These are considered less likely due to history of present illness, physical exam, labs/imaging findings  Review of prior external notes: 09/16/2038  Unique Tests and My Interpretation: None  Social Determinants of Health: none  Discussion with Independent Historian:  Partner, mother  Discussion of Management of Tests: None  Risk: Low: based on diagnostic testing/clinical impression and treatment plan  Risk Stratification Score: None  Plan: On exam patient was in no acute distress with stable vitals.  Patient physical exam clear neurologic exam is completely intact and patient is AO x 4.  Patient states that she is asymptomatic and states that she had a bad trip from the gummy.  Patient was tachycardic to 110 which is most likely from dehydration so we will orally rehydrate and recheck.  Patient does live at home with others and states that she feels comfortable going home with him and the feeling members in the room along with her partner agree with this.  Patient was not having any SI or HI as she states this was an accidental intoxication with the gummy.  Patient only had 1 can of 12 ounce beer and so strongly doubt alcohol withdraw.  Will reevaluate in 30 minutes and if heart rate is down will discharge with outpatient follow-up.  Patient signed out to [place], PA-C.  Please review their note for the  continuation of patient's care.  The plan at this point is follow-up and if heart rate is below 105 can discharge with outpatient follow-up.  This chart was dictated using voice recognition software.  Despite best efforts to proofread,  errors can occur which can change the documentation meaning.         Final Clinical Impression(s) / ED Diagnoses Final diagnoses:  None    Rx / DC Orders ED Discharge Orders     None         Remi Deter 02/25/23 2153    Tegeler, Canary Brim, MD 03/01/23  0834  

## 2023-02-25 NOTE — Discharge Instructions (Addendum)
Please follow-up with your primary care provider in regards recent ER visit.  Today your exam was reassuring most likely had a bad reaction to the gummy.  Please remain hydrated and follow-up with your primary care provider.  Symptoms change or worsen please return to the ER.

## 2023-02-25 NOTE — ED Triage Notes (Addendum)
Pt from home. Family called EMS due to her intoxication from CBD gummy and ETOH. Pt is yelling and calling out and paranoid. Denies combativeness. PT was ambulatory and followed commands.

## 2023-02-25 NOTE — ED Provider Notes (Signed)
  Physical Exam  BP (!) 162/96 (BP Location: Left Arm)   Pulse (!) 110   Temp 98.3 F (36.8 C) (Oral)   Resp 16   SpO2 98%   Physical Exam Awake, alert Comfortable appearing.   Procedures  Procedures  ED Course / MDM    Medical Decision Making  Gummie by accident Symptoms of panic, restlessness Tachy on arrival Recheck tachycardia, symptoms of panic Anticipate d/ch home  Heart rate is improved, she if feeling better over time. Comfortable with discharge home. Discussed return precautions.       Elpidio Anis, PA-C 02/25/23 2240    Tegeler, Canary Brim, MD 03/01/23 [phone]

## 2023-02-25 NOTE — ED Notes (Signed)
Pt is no longer screaming and alert and oriented and having a water and graham cracker.

## 2023-03-03 ENCOUNTER — Other Ambulatory Visit: Payer: Self-pay

## 2023-03-03 ENCOUNTER — Emergency Department (HOSPITAL_BASED_OUTPATIENT_CLINIC_OR_DEPARTMENT_OTHER)
Admission: EM | Admit: 2023-03-03 | Discharge: 2023-03-04 | Disposition: A | Discharge status: Home / Self Care | Payer: BLUE CROSS/BLUE SHIELD | Attending: Emergency Medicine | Admitting: Emergency Medicine

## 2023-03-03 DIAGNOSIS — R531 Weakness: Secondary | ICD-10-CM | POA: Diagnosis not present

## 2023-03-03 DIAGNOSIS — Z79899 Other long term (current) drug therapy: Secondary | ICD-10-CM | POA: Insufficient documentation

## 2023-03-03 DIAGNOSIS — R4789 Other speech disturbances: Secondary | ICD-10-CM | POA: Diagnosis not present

## 2023-03-03 DIAGNOSIS — Z7984 Long term (current) use of oral hypoglycemic drugs: Secondary | ICD-10-CM | POA: Diagnosis not present

## 2023-03-03 DIAGNOSIS — I10 Essential (primary) hypertension: Secondary | ICD-10-CM | POA: Diagnosis not present

## 2023-03-03 DIAGNOSIS — R519 Headache, unspecified: Secondary | ICD-10-CM | POA: Insufficient documentation

## 2023-03-03 DIAGNOSIS — E119 Type 2 diabetes mellitus without complications: Secondary | ICD-10-CM | POA: Diagnosis not present

## 2023-03-03 DIAGNOSIS — Z1152 Encounter for screening for COVID-19: Secondary | ICD-10-CM | POA: Diagnosis not present

## 2023-03-03 LAB — CBC WITH DIFFERENTIAL/PLATELET
Abs Immature Granulocytes: 0.03 10*3/uL (ref 0.00–0.07)
Basophils Absolute: 0 10*3/uL (ref 0.0–0.1)
Basophils Relative: 0 %
Eosinophils Absolute: 0.2 10*3/uL (ref 0.0–0.5)
Eosinophils Relative: 2 %
HCT: 40.4 % (ref 36.0–46.0)
Hemoglobin: 12.9 g/dL (ref 12.0–15.0)
Immature Granulocytes: 0 %
Lymphocytes Relative: 30 %
Lymphs Abs: 3.1 10*3/uL (ref 0.7–4.0)
MCH: 27.6 pg (ref 26.0–34.0)
MCHC: 31.9 g/dL (ref 30.0–36.0)
MCV: 86.3 fL (ref 80.0–100.0)
Monocytes Absolute: 0.7 10*3/uL (ref 0.1–1.0)
Monocytes Relative: 7 %
Neutro Abs: 6.5 10*3/uL (ref 1.7–7.7)
Neutrophils Relative %: 61 %
Platelets: 345 10*3/uL (ref 150–400)
RBC: 4.68 MIL/uL (ref 3.87–5.11)
RDW: 12.6 % (ref 11.5–15.5)
WBC: 10.6 10*3/uL — ABNORMAL HIGH (ref 4.0–10.5)
nRBC: 0 % (ref 0.0–0.2)

## 2023-03-03 LAB — URINALYSIS, ROUTINE W REFLEX MICROSCOPIC
Bilirubin Urine: NEGATIVE
Glucose, UA: NEGATIVE mg/dL
Hgb urine dipstick: NEGATIVE
Ketones, ur: NEGATIVE mg/dL
Nitrite: NEGATIVE
Protein, ur: 30 mg/dL — AB
Specific Gravity, Urine: >=1.03 (ref 1.005–1.030)
pH: 5.5 (ref 5.0–8.0)

## 2023-03-03 LAB — URINALYSIS, MICROSCOPIC (REFLEX)

## 2023-03-03 LAB — COMPREHENSIVE METABOLIC PANEL
ALT: 15 U/L (ref 0–44)
AST: 12 U/L — ABNORMAL LOW (ref 15–41)
Albumin: 4.5 g/dL (ref 3.5–5.0)
Alkaline Phosphatase: 74 U/L (ref 38–126)
Anion gap: 9 (ref 5–15)
BUN: 11 mg/dL (ref 6–20)
CO2: 24 mmol/L (ref 22–32)
Calcium: 9.5 mg/dL (ref 8.9–10.3)
Chloride: 102 mmol/L (ref 98–111)
Creatinine, Ser: 0.83 mg/dL (ref 0.44–1.00)
GFR, Estimated: >60 mL/min (ref >60)
Glucose, Bld: 127 mg/dL — ABNORMAL HIGH (ref 70–99)
Potassium: 3.6 mmol/L (ref 3.5–5.1)
Sodium: 135 mmol/L (ref 135–145)
Total Bilirubin: 0.5 mg/dL (ref <1.2)
Total Protein: 8.6 g/dL — ABNORMAL HIGH (ref 6.5–8.1)

## 2023-03-03 LAB — RESP PANEL BY RT-PCR (RSV, FLU A&B, COVID)  RVPGX2
Influenza A by PCR: NEGATIVE
Influenza B by PCR: NEGATIVE
Resp Syncytial Virus by PCR: NEGATIVE
SARS Coronavirus 2 by RT PCR: NEGATIVE

## 2023-03-03 LAB — LIPASE, BLOOD: Lipase: 45 U/L (ref 11–51)

## 2023-03-03 LAB — PREGNANCY, URINE: Preg Test, Ur: NEGATIVE

## 2023-03-03 NOTE — ED Triage Notes (Signed)
Pt reports that last week that she was seen at [facility] long after having a seizure for the first time. States that she has a new stutter now that she didn't have before. 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

## 2023-03-04 ENCOUNTER — Emergency Department (HOSPITAL_BASED_OUTPATIENT_CLINIC_OR_DEPARTMENT_OTHER): Payer: BLUE CROSS/BLUE SHIELD

## 2023-03-04 DIAGNOSIS — R519 Headache, unspecified: Secondary | ICD-10-CM | POA: Diagnosis not present

## 2023-03-04 MED ORDER — ACETAMINOPHEN 500 MG PO TABS
1000.0000 mg | ORAL_TABLET | Freq: Once | ORAL | Status: AC
  Administered 2023-03-04: 1000 mg via ORAL
  Filled 2023-03-04: qty 2

## 2023-03-04 MED ORDER — BUTALBITAL-APAP-CAFFEINE 50-325-40 MG PO TABS: 1.0000 | ORAL_TABLET | Freq: Once | ORAL | Status: DC

## 2023-03-04 NOTE — ED Provider Notes (Signed)
[place] EMERGENCY DEPARTMENT AT [facility] Provider Note   CSN: [id number] Arrival date & time: 03/03/23  1831     History  Chief Complaint  Patient presents with   Headache    Carla Elliott is a 47 y.o. female.  The history is provided by the patient.  Headache Carla Elliott is a 47 y.o. female who presents to the Emergency Department complaining of speech changes.  She presents to the emergency department due to concern for speech changes that started over the last week.  1 week ago she went to the [facility] emergency department due to concern for possible seizure.  She states at that time she had been playing a game and felt weird and her right leg started moving and her heart was beating fast and she started shaking throughout her body.  She thought she was having a heart attack at that time.  She states that since then she has been experiencing speech difficulties intermittently with stutter.  She is concerned because no tests were performed on that ED visit.  She is followed by neurology and was told to go to the emergency department for further evaluation. Overall since her ER visit 1 week ago she has been feeling tired, jittery.  No fevers, nausea, vomiting, diarrhea.  She is not sleeping well.  No dysuria.  She does have a lot of stress in her life currently.  No tobacco, alcohol, drug use.  No new medications.  Hx/o bell palsy, DM, HTN     Home Medications Prior to Admission medications   Medication Sig Start Date End Date Taking? Authorizing Provider  lisinopril (PRINIVIL,ZESTRIL) 10 MG tablet Take 10 mg by mouth daily.   Yes [provider]  metFORMIN (GLUCOPHAGE) 850 MG tablet Take 1 tablet (850 mg total) by mouth 2 (two) times daily with a meal. 04/12/13  Yes Cherrie Distance, PA-C  methocarbamol (ROBAXIN) 500 MG tablet Take 2 tablets (1,000 mg total) by mouth 4 (four) times daily as needed (Pain). 10/23/14  Yes Pisciotta, Joni Reining, PA-C   simvastatin (ZOCOR) 10 MG tablet Take 10 mg by mouth daily.   Yes [provider]  Albiglutide (TANZEUM) 30 MG PEN Inject into the skin once a week.    [provider]  cephALEXin (KEFLEX) 500 MG capsule Take 1 capsule (500 mg total) by mouth 3 (three) times daily. 11/29/16   Harris, Abigail, PA-C  glipiZIDE (GLUCOTROL) 5 MG tablet Take 1 tablet (5 mg total) by mouth daily. 04/12/13   Cherrie Distance, PA-C  HYDROcodone-acetaminophen (NORCO/VICODIN) 5-325 MG per tablet Take 1-2 tablets by mouth every 6 hours as needed for pain and/or cough. 10/23/14   Pisciotta, Joni Reining, PA-C  ibuprofen (ADVIL,MOTRIN) 800 MG tablet Take 1 tablet (800 mg total) by mouth every 8 (eight) hours as needed for mild pain. 10/19/14   Ward, Layla Maw, DO      Allergies    Patient has no known allergies.    Review of Systems   Review of Systems  Neurological:  Positive for headaches.  All other systems reviewed and are negative.   Physical Exam Updated Vital Signs BP (!) 146/74   Pulse 90   Temp 98.8 F (37.1 C) (Oral)   Resp 18   Ht 5\' 9"  (1.753 m)   Wt 115.7 kg   SpO2 99%   BMI 37.66 kg/m  Physical Exam Vitals and nursing note reviewed.  Constitutional:      Appearance: She is well-developed.  HENT:  Head: Normocephalic and atraumatic.  Cardiovascular:     Rate and Rhythm: Normal rate and regular rhythm.     Heart sounds: No murmur heard. Pulmonary:     Effort: Pulmonary effort is normal. No respiratory distress.     Breath sounds: Normal breath sounds.  Abdominal:     Palpations: Abdomen is soft.     Tenderness: There is no abdominal tenderness. There is no guarding or rebound.  Musculoskeletal:        General: No tenderness.  Skin:    General: Skin is warm and dry.  Neurological:     Mental Status: She is alert and oriented to person, place, and time.     Comments: Mid right upper and lower facial weakness. Visual fields grossly intact.  5/5 strength in all four extremities  with sensation to light touch intact in all four extremities.   Psychiatric:        Behavior: Behavior normal.     ED Results / Procedures / Treatments   Labs (all labs ordered are listed, but only abnormal results are displayed) Labs Reviewed  CBC WITH DIFFERENTIAL/PLATELET - Abnormal; Notable for the following components:      Result Value   WBC 10.6 (*)    All other components within normal limits  COMPREHENSIVE METABOLIC PANEL - Abnormal; Notable for the following components:   Glucose, Bld 127 (*)    Total Protein 8.6 (*)    AST 12 (*)    All other components within normal limits  URINALYSIS, ROUTINE W REFLEX MICROSCOPIC - Abnormal; Notable for the following components:   Protein, ur 30 (*)    Leukocytes,Ua SMALL (*)    All other components within normal limits  URINALYSIS, MICROSCOPIC (REFLEX) - Abnormal; Notable for the following components:   Bacteria, UA MANY (*)    All other components within normal limits  RESP PANEL BY RT-PCR (RSV, FLU A&B, COVID)  [id number]  URINE CULTURE  LIPASE, BLOOD  PREGNANCY, URINE    EKG None  Radiology CT Head Wo Contrast Result Date: 03/04/2023 CLINICAL DATA:  Headache, increasing frequency or severity and speech changes. Recent seizure. EXAM: CT HEAD WITHOUT CONTRAST TECHNIQUE: Contiguous axial images were obtained from the base of the skull through the vertex without intravenous contrast. RADIATION DOSE REDUCTION: This exam was performed according to the departmental dose-optimization program which includes automated exposure control, adjustment of the mA and/or kV according to patient size and/or use of iterative reconstruction technique. COMPARISON:  CT head 04/09/2022 FINDINGS: Brain: No intracranial hemorrhage, mass effect, or evidence of acute infarct. No hydrocephalus. No extra-axial fluid collection. Hypoattenuation within the periventricular white matter is greater than expected for age. This is similar to progressed from 04/09/2022.  Vascular: No hyperdense vessel or unexpected calcification. Skull: No fracture or focal lesion. Sinuses/Orbits: No acute finding. Other: None. IMPRESSION: 1. No acute intracranial abnormality. 2. Hypoattenuation within the periventricular white matter is greater than expected for age. This is similar to progressed from 04/09/2022. Differential considerations include age advanced microvascular ischemic disease, demyelinating process such as multiple sclerosis, or vasculitis. MRI would be helpful for further evaluation. Electronically Signed   By: Minerva Fester M.D.   On: 03/04/2023 02:04    Procedures Procedures    Medications Ordered in ED Medications  acetaminophen (TYLENOL) tablet 1,000 mg (1,000 mg Oral Given 03/04/23 0141)    ED Course/ Medical Decision Making/ A&P  Medical Decision Making Amount and/or Complexity of Data Reviewed Labs: ordered. Radiology: ordered.  Risk OTC drugs.   Patient with history of Bell's palsy, diabetes and hypertension here for evaluation of speech changes over the last week.  She does have some mild right-sided facial changes, otherwise speech is fluent and her exam is neurologically intact.  Facial exam is consistent with Bell's palsy.  UA does have bacteria present, she does not have any urinary symptoms currently.  Will send a culture and will treat if this comes back positive.  CT head with chronic changes, no evidence of acute CVA or space-occupying lesion.  Current presentation is not consistent with status, subarachnoid hemorrhage, CVA.  Suspect there is some underlying stress triggering her speech changes.  Discussed importance of continuing to follow-up with neurology as well as return precautions.        Final Clinical Impression(s) / ED Diagnoses Final diagnoses:  Bad headache  Other speech disturbance    Rx / DC Orders ED Discharge Orders     None         Tilden Fossa, MD 03/04/23  [phone]

## 2023-03-05 LAB — URINE CULTURE: Culture: <10000 — AB
